# Patient Record
Sex: Male | Born: 1970
Health system: Southern US, Community
[De-identification: ages and names within clinical notes are randomized; demographics above are authoritative.]

## PROBLEM LIST (undated history)

## (undated) DIAGNOSIS — F419 Anxiety disorder, unspecified: Secondary | ICD-10-CM

## (undated) DIAGNOSIS — K219 Gastro-esophageal reflux disease without esophagitis: Secondary | ICD-10-CM

## (undated) DIAGNOSIS — D179 Benign lipomatous neoplasm, unspecified: Secondary | ICD-10-CM

## (undated) DIAGNOSIS — J189 Pneumonia, unspecified organism: Secondary | ICD-10-CM

## (undated) DIAGNOSIS — M1990 Unspecified osteoarthritis, unspecified site: Secondary | ICD-10-CM

## (undated) DIAGNOSIS — F329 Major depressive disorder, single episode, unspecified: Secondary | ICD-10-CM

## (undated) DIAGNOSIS — F32A Depression, unspecified: Secondary | ICD-10-CM

## (undated) DIAGNOSIS — I10 Essential (primary) hypertension: Secondary | ICD-10-CM

## (undated) DIAGNOSIS — J45909 Unspecified asthma, uncomplicated: Secondary | ICD-10-CM

## (undated) HISTORY — DX: Essential (primary) hypertension: I10

## (undated) HISTORY — DX: Major depressive disorder, single episode, unspecified: F32.9

## (undated) HISTORY — DX: Anxiety disorder, unspecified: F41.9

## (undated) HISTORY — PX: TONSILLECTOMY: SUR1361

## (undated) HISTORY — DX: Depression, unspecified: F32.A

---
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
## 2003-10-31 HISTORY — PX: LIPOMA EXCISION: SHX5283

## 2004-08-11 ENCOUNTER — Other Ambulatory Visit: Admission: RE | Admit: 2004-08-11 | Discharge: 2004-08-11 | Payer: Self-pay | Admitting: Otolaryngology

## 2007-05-29 ENCOUNTER — Emergency Department (HOSPITAL_COMMUNITY): Admission: EM | Admit: 2007-05-29 | Discharge: 2007-05-29 | Payer: Self-pay | Admitting: Emergency Medicine

## 2011-08-14 LAB — URINALYSIS, ROUTINE W REFLEX MICROSCOPIC
Bilirubin Urine: NEGATIVE
Glucose, UA: NEGATIVE
Ketones, ur: NEGATIVE
Protein, ur: NEGATIVE
pH: 6.5

## 2011-08-14 LAB — CBC
HCT: 42.2
Hemoglobin: 14.6
MCHC: 34.6
MCV: 92.9
Platelets: 196
RDW: 12.8

## 2011-08-14 LAB — BASIC METABOLIC PANEL
BUN: 20
CO2: 28
Chloride: 103
Glucose, Bld: 105 — ABNORMAL HIGH
Potassium: 4.7
Sodium: 137

## 2011-08-14 LAB — DIFFERENTIAL
Basophils Absolute: 0
Eosinophils Absolute: 0.2
Eosinophils Relative: 2
Lymphocytes Relative: 25
Monocytes Absolute: 0.7

## 2011-08-14 LAB — RAPID URINE DRUG SCREEN, HOSP PERFORMED
Benzodiazepines: NOT DETECTED
Cocaine: NOT DETECTED
Opiates: NOT DETECTED

## 2014-04-04 ENCOUNTER — Emergency Department (HOSPITAL_COMMUNITY): Payer: BC Managed Care – PPO

## 2014-04-04 ENCOUNTER — Encounter (HOSPITAL_COMMUNITY): Payer: Self-pay | Admitting: Emergency Medicine

## 2014-04-04 ENCOUNTER — Emergency Department (HOSPITAL_COMMUNITY)
Admission: EM | Admit: 2014-04-04 | Discharge: 2014-04-05 | Disposition: A | Discharge status: Home / Self Care | Payer: BC Managed Care – PPO | Attending: Emergency Medicine | Admitting: Emergency Medicine

## 2014-04-04 DIAGNOSIS — R1011 Right upper quadrant pain: Secondary | ICD-10-CM | POA: Insufficient documentation

## 2014-04-04 DIAGNOSIS — Z79899 Other long term (current) drug therapy: Secondary | ICD-10-CM | POA: Insufficient documentation

## 2014-04-04 DIAGNOSIS — R109 Unspecified abdominal pain: Secondary | ICD-10-CM

## 2014-04-04 LAB — CBC WITH DIFFERENTIAL/PLATELET
Basophils Absolute: 0.1 10*3/uL (ref 0.0–0.1)
Basophils Relative: 1 % (ref 0–1)
EOS ABS: 0.3 10*3/uL (ref 0.0–0.7)
Eosinophils Relative: 4 % (ref 0–5)
HCT: 45 % (ref 39.0–52.0)
HEMOGLOBIN: 15.5 g/dL (ref 13.0–17.0)
LYMPHS ABS: 2 10*3/uL (ref 0.7–4.0)
LYMPHS PCT: 27 % (ref 12–46)
MCH: 31.6 pg (ref 26.0–34.0)
MCHC: 34.4 g/dL (ref 30.0–36.0)
MCV: 91.6 fL (ref 78.0–100.0)
MONOS PCT: 10 % (ref 3–12)
Monocytes Absolute: 0.8 10*3/uL (ref 0.1–1.0)
NEUTROS PCT: 58 % (ref 43–77)
Neutro Abs: 4.5 10*3/uL (ref 1.7–7.7)
Platelets: 200 10*3/uL (ref 150–400)
RBC: 4.91 MIL/uL (ref 4.22–5.81)
RDW: 12.9 % (ref 11.5–15.5)
WBC: 7.6 10*3/uL (ref 4.0–10.5)

## 2014-04-04 LAB — COMPREHENSIVE METABOLIC PANEL
ALK PHOS: 55 U/L (ref 39–117)
ALT: 64 U/L — ABNORMAL HIGH (ref 0–53)
AST: 51 U/L — ABNORMAL HIGH (ref 0–37)
Albumin: 4.4 g/dL (ref 3.5–5.2)
BILIRUBIN TOTAL: 0.3 mg/dL (ref 0.3–1.2)
BUN: 18 mg/dL (ref 6–23)
CO2: 25 meq/L (ref 19–32)
Calcium: 10 mg/dL (ref 8.4–10.5)
Chloride: 100 mEq/L (ref 96–112)
Creatinine, Ser: 1.15 mg/dL (ref 0.50–1.35)
GFR, EST AFRICAN AMERICAN: 89 mL/min — AB (ref >90)
GFR, EST NON AFRICAN AMERICAN: 77 mL/min — AB (ref >90)
GLUCOSE: 54 mg/dL — AB (ref 70–99)
POTASSIUM: 3.7 meq/L (ref 3.7–5.3)
Sodium: 141 mEq/L (ref 137–147)
TOTAL PROTEIN: 8.2 g/dL (ref 6.0–8.3)

## 2014-04-04 LAB — CBG MONITORING, ED: Glucose-Capillary: 71 mg/dL (ref 70–99)

## 2014-04-04 LAB — LIPASE, BLOOD: LIPASE: 43 U/L (ref 11–59)

## 2014-04-04 MED ORDER — IOHEXOL 300 MG/ML  SOLN
100.0000 mL | Freq: Once | INTRAMUSCULAR | Status: AC | PRN
  Administered 2014-04-04: 100 mL via INTRAVENOUS

## 2014-04-04 MED ORDER — MORPHINE SULFATE 4 MG/ML IJ SOLN
4.0000 mg | Freq: Once | INTRAMUSCULAR | Status: AC
  Administered 2014-04-04: 4 mg via INTRAVENOUS
  Filled 2014-04-04: qty 1

## 2014-04-04 MED ORDER — SODIUM CHLORIDE 0.9 % IV SOLN
Freq: Once | INTRAVENOUS | Status: AC
Start: 2014-04-04 — End: 2014-04-04
  Administered 2014-04-04: 21:00:00 via INTRAVENOUS

## 2014-04-04 MED ORDER — ONDANSETRON HCL 4 MG/2ML IJ SOLN
4.0000 mg | Freq: Once | INTRAMUSCULAR | Status: AC
  Administered 2014-04-04: 4 mg via INTRAVENOUS
  Filled 2014-04-04: qty 2

## 2014-04-04 NOTE — ED Notes (Signed)
Pt states he thought he had injured his right side of rib care. Parents both had gall bladders removed. Pt presents with ride sided pain, states he cannot make it worse when pressing on rib cage. States he has also had diarrhea for one week but it ended today. States that could be unrelated and it could have been the [employer] he ate. Pt states pain aggravates when he moves his arms, states he is concerned as well because he has been drinking more beer this year than usual so he is worried it might be his liver, after his wife and 8 kids left him. Pt states he has been under a lot of stress.

## 2014-04-04 NOTE — ED Notes (Signed)
Pain is also worse with deep breaths.

## 2014-04-04 NOTE — ED Notes (Signed)
Called CT to make them aware that patient has finished his contrast.

## 2014-04-04 NOTE — ED Notes (Signed)
Pt.  reports pain at right lateral ribcage for 3 weeks worse when raising right arm /bending and certain positions , respirations unlabored / denies chest pain / no crepitance or deformity.

## 2014-04-05 MED ORDER — HYDROCODONE-ACETAMINOPHEN 5-325 MG PO TABS: 1.0000 | ORAL_TABLET | ORAL | Status: DC | PRN

## 2014-04-05 MED ORDER — IBUPROFEN 600 MG PO TABS: 600.0000 mg | ORAL_TABLET | Freq: Four times a day (QID) | ORAL | Status: DC | PRN

## 2014-04-05 NOTE — ED Provider Notes (Signed)
CSN: [id number]     Arrival date & time 04/04/14  1949 History   First MD Initiated Contact with Patient 04/04/14 2043     Chief Complaint  Patient presents with  . Abdominal Pain    right sided     (Consider location/radiation/quality/duration/timing/severity/associated sxs/prior Treatment) HPI Comments: Patient with 3, weeks of right upper quadrant, pain, and right lateral side.  Pain cannot relate this to food consumption.  He does, state that he has been drinking more than usual for the past 6 months or so since a divorce and separation from his children.  Denies any trauma, fever, diarrhea, vomiting.  Has not really taken any medication for this discomfort  Patient is a 43 y.o. male presenting with abdominal pain. The history is provided by the patient.  Abdominal Pain Pain location:  RUQ Pain quality: aching   Pain radiates to:  Does not radiate Associated symptoms: no chills, no cough, no diarrhea, no fever, no nausea, no shortness of breath and no vomiting     History reviewed. No pertinent past medical history. History reviewed. No pertinent past surgical history. No family history on file. History  Substance Use Topics  . Smoking status: Never Smoker   . Smokeless tobacco: Not on file  . Alcohol Use: No    Review of Systems  Constitutional: Negative for fever and chills.  Respiratory: Negative for cough and shortness of breath.   Gastrointestinal: Positive for abdominal pain. Negative for nausea, vomiting and diarrhea.  Genitourinary: Negative for flank pain.  Skin: Negative for rash.  All other systems reviewed and are negative.     Allergies  Review of patient's allergies indicates no known allergies.  Home Medications   Prior to Admission medications   Medication Sig Start Date End Date Taking? Authorizing Provider  Aspirin-Acetaminophen-Caffeine (EXCEDRIN PO) Take 2 tablets by mouth every 8 (eight) hours as needed (headache/pain).   Yes Historical  Provider, MD  FLUoxetine (PROZAC) 20 MG capsule Take 20 mg by mouth daily.   Yes Historical Provider, MD  HYDROcodone-acetaminophen (NORCO/VICODIN) 5-325 MG per tablet Take 1 tablet by mouth every 4 (four) hours as needed. 04/05/14   Garald Balding, NP  ibuprofen (ADVIL,MOTRIN) 600 MG tablet Take 1 tablet (600 mg total) by mouth every 6 (six) hours as needed. 04/05/14   Garald Balding, NP   BP 137/96  Pulse 61  Temp(Src) 98.4 F (36.9 C) (Oral)  Resp 18  Ht 6' (1.829 m)  Wt 188 lb (85.276 kg)  BMI 25.49 kg/m2  SpO2 98% Physical Exam  Nursing note and vitals reviewed. Constitutional: He is oriented to person, place, and time. He appears well-developed and well-nourished.  HENT:  Head: Normocephalic.  Eyes: Pupils are equal, round, and reactive to light.  Neck: Normal range of motion.  Cardiovascular: Normal rate and regular rhythm.   Pulmonary/Chest: Effort normal and breath sounds normal.  Abdominal: Soft. He exhibits no distension. There is tenderness in the right upper quadrant. There is positive Murphy's sign. There is no CVA tenderness.    Musculoskeletal: Normal range of motion.  Neurological: He is alert and oriented to person, place, and time.  Skin: Skin is warm. No rash noted.    ED Course  Procedures (including critical care time) Labs Review Labs Reviewed  COMPREHENSIVE METABOLIC PANEL - Abnormal; Notable for the following:    Glucose, Bld 54 (*)    AST 51 (*)    ALT 64 (*)    GFR calc non Af  Amer 77 (*)    GFR calc Af Amer 89 (*)    All other components within normal limits  CBC WITH DIFFERENTIAL  LIPASE, BLOOD  CBG MONITORING, ED    Imaging Review Dg Ribs Unilateral W/chest Right  04/04/2014   CLINICAL DATA:  Pain in the right axillary region for 3 weeks without known injury  EXAM: RIGHT RIBS AND CHEST - 3+ VIEW  COMPARISON:  None.  FINDINGS: The lungs are well-expanded and clear. The heart and mediastinal structures are normal. There is no pleural effusion or  pneumothorax. The symptomatic area over the right lower ribcage was marked with a metallic BB. There is no acute or chronic bony abnormality.  IMPRESSION: There is no acute cardiopulmonary abnormality nor acute the bony thoracic abnormality. If the patient's symptoms persist and remain unexplained, follow-up CT scanning may be useful.   Electronically Signed   By: David  Martinique   On: 04/04/2014 20:47   Ct Abdomen Pelvis W Contrast  04/04/2014   CLINICAL DATA:  Right-sided abdominal pain.  EXAM: CT ABDOMEN AND PELVIS WITH CONTRAST  TECHNIQUE: Multidetector CT imaging of the abdomen and pelvis was performed using the standard protocol following bolus administration of intravenous contrast.  CONTRAST:  130mL OMNIPAQUE IOHEXOL 300 MG/ML  SOLN  COMPARISON:  None.  FINDINGS: The lung bases are clear. No pleural effusion or pulmonary lesion. Minimal streaky subsegmental atelectasis at the left lung base. The heart is normal in size. No pericardial effusion. The distal esophagus is grossly normal. The lower ribs are intact.  The solid abdominal organs are normal. No inflammatory changes or mass lesions. The gallbladder is normal. Borderline common bile duct at 7.5 mm. The pancreatic duct is normal.  The stomach, duodenum, small bowel and colon are unremarkable. No inflammatory changes, mass lesions or obstructive findings. The appendix is normal. No mesenteric or retroperitoneal mass or adenopathy. Small scattered mesenteric lymph nodes are noted. The aorta is normal in caliber. The branch vessels are normal. The major venous structures are patent.  The bladder, prostate gland and seminal vesicles are unremarkable. No pelvic mass, adenopathy or free pelvic fluid collections. No inguinal mass or adenopathy.  The bony structures are unremarkable.  IMPRESSION: Unremarkable abdominal/ pelvic CT scan. No acute findings, mass lesions or lymphadenopathy.   Electronically Signed   By: Kalman Jewels M.D.   On: 04/04/2014 23:57       EKG Interpretation None      MDM  CT scan, is normal, not revealing any cause for his discomfort is noted, that the common bile duct is slightly dilated.  Gallbladder is surgically absent.  LFTs show slight elevation in AST, ALT.  Patient has been cautioned that he needs to stop drinking and have this monitored by his primary care physician. Is been discharged him with prescription for 10 Vicodin to use as needed.  For severe pain Final diagnoses:  Abdominal pain         Garald Balding, NP 04/05/14 [phone]

## 2014-04-05 NOTE — Discharge Instructions (Signed)
Abdominal Pain, Adult Many things can cause belly (abdominal) pain. Most times, the belly pain is not dangerous. Many cases of belly pain can be watched and treated at home. HOME CARE   Do not take medicines that help you go poop (laxatives) unless told to by your doctor.  Only take medicine as told by your doctor.  Eat or drink as told by your doctor. Your doctor will tell you if you should be on a special diet. GET HELP IF:  You do not know what is causing your belly pain.  You have belly pain while you are sick to your stomach (nauseous) or have runny poop (diarrhea).  You have pain while you pee or poop.  Your belly pain wakes you up at night.  You have belly pain that gets worse or better when you eat.  You have belly pain that gets worse when you eat fatty foods. GET HELP RIGHT AWAY IF:   The pain does not go away within 2 hours.  You have a fever.  You keep throwing up (vomiting).  The pain changes and is only in the right or left part of the belly.  You have bloody or tarry looking poop. MAKE SURE YOU:   Understand these instructions.  Will watch your condition.  Will get help right away if you are not doing well or get worse. Document Released: 04/03/2008 Document Revised: 08/06/2013 Document Reviewed: 06/25/2013 [facility] Patient Information 2014 [place]. Your CT Scan is normal you have been give na prescription for pain control and a referral to your PCP for follow up care You do have mildly elevated liver function test this will ned to be monitored  It is recommended that you stop drinking on a regular basis as this is starting effect your liver.

## 2014-04-05 NOTE — ED Provider Notes (Signed)
Medical screening examination/treatment/procedure(s) were performed by non-physician practitioner and as supervising physician I was immediately available for consultation/collaboration.   Dorie Rank, MD 04/05/14 [phone]

## 2018-01-06 ENCOUNTER — Emergency Department (HOSPITAL_BASED_OUTPATIENT_CLINIC_OR_DEPARTMENT_OTHER)
Admission: EM | Admit: 2018-01-06 | Discharge: 2018-01-06 | Disposition: A | Discharge status: Home / Self Care | Payer: Managed Care, Other (non HMO) | Attending: Emergency Medicine | Admitting: Emergency Medicine

## 2018-01-06 ENCOUNTER — Other Ambulatory Visit: Payer: Self-pay

## 2018-01-06 ENCOUNTER — Emergency Department (HOSPITAL_BASED_OUTPATIENT_CLINIC_OR_DEPARTMENT_OTHER): Payer: Managed Care, Other (non HMO)

## 2018-01-06 ENCOUNTER — Encounter (HOSPITAL_BASED_OUTPATIENT_CLINIC_OR_DEPARTMENT_OTHER): Payer: Self-pay | Admitting: Emergency Medicine

## 2018-01-06 DIAGNOSIS — R109 Unspecified abdominal pain: Secondary | ICD-10-CM | POA: Insufficient documentation

## 2018-01-06 DIAGNOSIS — Z79899 Other long term (current) drug therapy: Secondary | ICD-10-CM | POA: Insufficient documentation

## 2018-01-06 LAB — CBC WITH DIFFERENTIAL/PLATELET
BASOS ABS: 0 10*3/uL (ref 0.0–0.1)
BASOS PCT: 0 %
EOS PCT: 2 %
Eosinophils Absolute: 0.2 10*3/uL (ref 0.0–0.7)
HCT: 45.9 % (ref 39.0–52.0)
Hemoglobin: 15.7 g/dL (ref 13.0–17.0)
Lymphocytes Relative: 26 %
Lymphs Abs: 2.1 10*3/uL (ref 0.7–4.0)
MCH: 32 pg (ref 26.0–34.0)
MCHC: 34.2 g/dL (ref 30.0–36.0)
MCV: 93.5 fL (ref 78.0–100.0)
MONO ABS: 1 10*3/uL (ref 0.1–1.0)
Monocytes Relative: 12 %
NEUTROS ABS: 4.8 10*3/uL (ref 1.7–7.7)
Neutrophils Relative %: 60 %
PLATELETS: 207 10*3/uL (ref 150–400)
RBC: 4.91 MIL/uL (ref 4.22–5.81)
RDW: 13.6 % (ref 11.5–15.5)
WBC: 8 10*3/uL (ref 4.0–10.5)

## 2018-01-06 LAB — COMPREHENSIVE METABOLIC PANEL
ALBUMIN: 4.5 g/dL (ref 3.5–5.0)
ALT: 70 U/L — ABNORMAL HIGH (ref 17–63)
AST: 39 U/L (ref 15–41)
Alkaline Phosphatase: 67 U/L (ref 38–126)
Anion gap: 9 (ref 5–15)
BUN: 20 mg/dL (ref 6–20)
CHLORIDE: 106 mmol/L (ref 101–111)
CO2: 27 mmol/L (ref 22–32)
Calcium: 9.5 mg/dL (ref 8.9–10.3)
Creatinine, Ser: 1.32 mg/dL — ABNORMAL HIGH (ref 0.61–1.24)
GFR calc Af Amer: >60 mL/min (ref >60)
GFR calc non Af Amer: >60 mL/min (ref >60)
GLUCOSE: 92 mg/dL (ref 65–99)
Potassium: 4.3 mmol/L (ref 3.5–5.1)
Sodium: 142 mmol/L (ref 135–145)
Total Bilirubin: 0.5 mg/dL (ref 0.3–1.2)
Total Protein: 7.9 g/dL (ref 6.5–8.1)

## 2018-01-06 LAB — URINALYSIS, ROUTINE W REFLEX MICROSCOPIC
Bilirubin Urine: NEGATIVE
GLUCOSE, UA: NEGATIVE mg/dL
HGB URINE DIPSTICK: NEGATIVE
KETONES UR: NEGATIVE mg/dL
LEUKOCYTES UA: NEGATIVE
Nitrite: NEGATIVE
PROTEIN: NEGATIVE mg/dL
Specific Gravity, Urine: <1.005 — ABNORMAL LOW (ref 1.005–1.030)
pH: 5.5 (ref 5.0–8.0)

## 2018-01-06 LAB — LIPASE, BLOOD: Lipase: 23 U/L (ref 11–51)

## 2018-01-06 MED ORDER — CYCLOBENZAPRINE HCL 10 MG PO TABS: 10.0000 mg | ORAL_TABLET | Freq: Two times a day (BID) | ORAL | 0 refills | Status: DC | PRN

## 2018-01-06 NOTE — ED Notes (Signed)
Not in room, pt in xray. 

## 2018-01-06 NOTE — ED Triage Notes (Signed)
Pt recently tx for flu. States he has been sore from coughing. Pt reports he has R side flank pain that is worse today. No tenderness to palpation.

## 2018-01-06 NOTE — ED Notes (Signed)
EDP into room 

## 2018-01-06 NOTE — ED Provider Notes (Signed)
[place] EMERGENCY DEPARTMENT Provider Note   CSN: [id number] Arrival date & time: 01/06/18  1529     History   Chief Complaint Chief Complaint  Patient presents with  . Flank Pain    HPI Christopher Andersen is a 47 y.o. male.  HPI  3 wk with right flank pain, had cough beginning one month ago. Went to urgent care 1 week ago, taking ibuprofen and cough medication without relief.  Thought it was muscle strain from coughing. Today pain has increased, worst it has been. Doesn't want to move given severe pain.  Not coughing anymore.   Not moving helps pain. Rolling over, getting up makes it worse. Can't lay on that side.  No fevers, no nausea, no vomiting, no abdominal pain, no dysuria, no hematuria, no numbness/weakness, no chest pain or dyspnea, no leg or leg swelling.  History reviewed. No pertinent past medical history.  There are no active problems to display for this patient.   History reviewed. No pertinent surgical history.     Home Medications    Prior to Admission medications   Medication Sig Start Date End Date Taking? Authorizing Provider  citalopram (CELEXA) 20 MG tablet Take 20 mg by mouth daily.   Yes [provider]  mirtazapine (REMERON) 30 MG tablet Take 30 mg by mouth at bedtime.   Yes [provider]  Aspirin-Acetaminophen-Caffeine (EXCEDRIN PO) Take 2 tablets by mouth every 8 (eight) hours as needed (headache/pain).    [provider]  cyclobenzaprine (FLEXERIL) 10 MG tablet Take 1 tablet (10 mg total) by mouth 2 (two) times daily as needed for muscle spasms. 01/06/18   Gareth Morgan, MD  FLUoxetine (PROZAC) 20 MG capsule Take 20 mg by mouth daily.    [provider]  HYDROcodone-acetaminophen (NORCO/VICODIN) 5-325 MG per tablet Take 1 tablet by mouth every 4 (four) hours as needed. 04/05/14   Junius Creamer, NP  ibuprofen (ADVIL,MOTRIN) 600 MG tablet Take 1 tablet (600 mg total) by mouth every 6 (six) hours as  needed. 04/05/14   Junius Creamer, NP    Family History No family history on file.  Social History Social History   Tobacco Use  . Smoking status: Never Smoker  . Smokeless tobacco: Never Used  Substance Use Topics  . Alcohol use: Yes  . Drug use: No     Allergies   Patient has no known allergies.   Review of Systems Review of Systems  Constitutional: Negative for fever.  HENT: Negative for sore throat.   Eyes: Negative for visual disturbance.  Respiratory: Negative for cough and shortness of breath.   Cardiovascular: Negative for chest pain.  Gastrointestinal: Positive for abdominal pain (denies but then on exam reports area of pain also in RUQ). Negative for diarrhea, nausea and vomiting.  Genitourinary: Positive for flank pain. Negative for difficulty urinating and hematuria.  Musculoskeletal: Negative for back pain and neck stiffness.  Skin: Negative for rash.  Neurological: Negative for syncope and headaches.     Physical Exam Updated Vital Signs BP (!) 154/94 (BP Location: Right Arm)   Pulse 80   Temp 98.5 F (36.9 C) (Oral)   Resp 16   Ht 6' (1.829 m)   Wt 100.7 kg (222 lb)   SpO2 97%   BMI 30.11 kg/m   Physical Exam  Constitutional: He is oriented to person, place, and time. He appears well-developed and well-nourished. No distress.  HENT:  Head: Normocephalic and atraumatic.  Eyes: Conjunctivae and EOM  are normal.  Neck: Normal range of motion.  Cardiovascular: Normal rate, regular rhythm, normal heart sounds and intact distal pulses. Exam reveals no gallop and no friction rub.  No murmur heard. Pulmonary/Chest: Effort normal and breath sounds normal. No respiratory distress. He has no wheezes. He has no rales.  Abdominal: Soft. He exhibits no distension. There is tenderness (epigastrum, RUQ, neg murphy's). There is no guarding.  No cva tenderness   Musculoskeletal: He exhibits no edema.  Neurological: He is alert and oriented to person, place, and  time.  Skin: Skin is warm and dry. He is not diaphoretic.  Nursing note and vitals reviewed.    ED Treatments / Results  Labs (all labs ordered are listed, but only abnormal results are displayed) Labs Reviewed  URINALYSIS, ROUTINE W REFLEX MICROSCOPIC - Abnormal; Notable for the following components:      Result Value   Color, Urine STRAW (*)    Specific Gravity, Urine <1.005 (*)    All other components within normal limits  COMPREHENSIVE METABOLIC PANEL - Abnormal; Notable for the following components:   Creatinine, Ser 1.32 (*)    ALT 70 (*)    All other components within normal limits  CBC WITH DIFFERENTIAL/PLATELET  LIPASE, BLOOD    EKG  EKG Interpretation None       Radiology Dg Chest 2 View  Result Date: 01/06/2018 CLINICAL DATA:  Chest pain. EXAM: CHEST - 2 VIEW COMPARISON:  April 04, 2014 FINDINGS: The heart size and mediastinal contours are within normal limits. Both lungs are clear. The visualized skeletal structures are unremarkable. IMPRESSION: No active cardiopulmonary disease. Electronically Signed   By: Dorise Bullion III M.D   On: 01/06/2018 19:19    Procedures Procedures (including critical care time)  Medications Ordered in ED Medications - No data to display   Initial Impression / Assessment and Plan / ED Course  I have reviewed the triage vital signs and the nursing notes.  Pertinent labs & imaging results that were available during my care of the patient were reviewed by me and considered in my medical decision making (see chart for details).    47 year old male with no segment medical history presents with 3 weeks of right-sided flank pain.  Reports that the pain was initially diagnosed as a muscular strain secondary to coughing, does acknowledge its worse with movement, however has continued despite him no longer coughing.  Patient with certain portions of history which are concerning for muscular skeletal pain, for example pain that is worse  with movements, but does not exhibit tenderness over the area that he reports having pain.  He does have some mild right upper quadrant tenderness, and CMP and lipase were done which showed no significant abnormalities.  Have low suspicion for acute cholecystitis, given he is afebrile, without nausea or vomiting, no pain that is worse after eating.  Have low suspicion for pulmonary embolus given he is PERC negative without dyspnea.  Hx and exam not consistent with no nephrolithiasis, and UA shows no sign of urinary tract infection.  Recommend patient find a primary care physician for continued evaluation of the right flank pain.  Will give prescription for Flexeril for further relief of possible muscular pain, but do feel he requires further outpatient evaluation.  Final Clinical Impressions(s) / ED Diagnoses   Final diagnoses:  Right flank pain    ED Discharge Orders        Ordered    cyclobenzaprine (FLEXERIL) 10 MG tablet  2  times daily PRN     01/06/18 2015       Gareth Morgan, MD 01/07/18 1036

## 2018-01-06 NOTE — ED Notes (Signed)
Back from xray. Amiable, alert, NAD, calm, interactive, resps e/u, speaking in clear complete sentences, no dyspnea noted, skin W&D, VSS, reports pain with movement or laughing, "fine while resting still" (denies: pain at this time, sob, nausea, dizziness or visual changes).

## 2018-01-15 ENCOUNTER — Encounter: Payer: Self-pay | Admitting: Medical

## 2018-01-15 ENCOUNTER — Ambulatory Visit (INDEPENDENT_AMBULATORY_CARE_PROVIDER_SITE_OTHER): Payer: Managed Care, Other (non HMO) | Admitting: Medical

## 2018-01-15 VITALS — BP 132/90 | HR 87 | Temp 97.8°F | Resp 16 | Ht 72.0 in | Wt 222.0 lb

## 2018-01-15 DIAGNOSIS — F329 Major depressive disorder, single episode, unspecified: Secondary | ICD-10-CM

## 2018-01-15 DIAGNOSIS — R0781 Pleurodynia: Secondary | ICD-10-CM

## 2018-01-15 DIAGNOSIS — F419 Anxiety disorder, unspecified: Secondary | ICD-10-CM | POA: Insufficient documentation

## 2018-01-15 MED ORDER — CYCLOBENZAPRINE HCL 5 MG PO TABS: 5.0000 mg | ORAL_TABLET | Freq: Three times a day (TID) | ORAL | 1 refills | Status: DC | PRN

## 2018-01-15 MED ORDER — DICLOFENAC SODIUM 75 MG PO TBEC
75.0000 mg | DELAYED_RELEASE_TABLET | Freq: Two times a day (BID) | ORAL | 0 refills | Status: DC
Start: 2018-01-15 — End: 2018-01-30

## 2018-01-15 NOTE — Progress Notes (Signed)
Subjective:    Patient ID: Christopher Andersen, male    DOB: 07/29/1971, 47 y.o.   MRN: [id number]  HPI    Pt in for first time. Pt works [employer]. Supervisor at call center. Does not exercise regularly. Pt states eats moderate healthy/admits to much fried foods, 4-6 cups of coffee a day, alcohol use about 3-5 beers a day. Divorced. 8 children. Youngest 53 yo. Oldest 52 years old.   Pt states 2 weeks ago had severe cough and eventually got some rib pain, flank pain and back pain. He went to the emergency dept and had work out which was negative. The pain location varies sometimes in rt lower rib and left lower rib area. Pain will change location. Pain at time acute when breaths deep, cough or sneezes. No reporting any direct abdomen pain. No skin rash. Pt states was extreme and constant preceding pain in ribs described.  In ED on 01-06-2018 xray was done. Was negative. CMP done mild alt elevation and CR 1.32. His cbc was normal.  Urine looked clear.  Pt states since work up was negative he was told pain probably muscular. He points out that pain decreased with muscle relaxant so he thinks maybe is muscle pain.   Pt has history of anxiety with depression. Pt is on remeron and cymbalta. Pt saw psychiatrist on 01-01-2018. Mood is stable/good.    Review of Systems  Constitutional: Negative for chills, fatigue and fever.  Respiratory: Negative for cough, chest tightness, shortness of breath and wheezing.   Cardiovascular: Negative for chest pain and palpitations.  Gastrointestinal: Negative for abdominal distention, abdominal pain, anal bleeding, blood in stool and nausea.  Musculoskeletal: Negative for back pain, joint swelling, myalgias and neck pain.       Rib pain lower ribs. See hpi.  Just got back from [employer] and [employer].  Skin: Negative for rash.  Neurological: Negative for dizziness, seizures, speech difficulty, weakness, light-headedness and numbness.    Hematological: Negative for adenopathy. Does not bruise/bleed easily.  Psychiatric/Behavioral: Negative for behavioral problems and confusion. The patient is not nervous/anxious.     Past Medical History:  Diagnosis Date  . Depression      Social History   Socioeconomic History  . Marital status: Divorced    Spouse name: Not on file  . Number of children: Not on file  . Years of education: Not on file  . Highest education level: Not on file  Social Needs  . Financial resource strain: Not on file  . Food insecurity - worry: Not on file  . Food insecurity - inability: Not on file  . Transportation needs - medical: Not on file  . Transportation needs - non-medical: Not on file  Occupational History  . Not on file  Tobacco Use  . Smoking status: Never Smoker  . Smokeless tobacco: Never Used  Substance and Sexual Activity  . Alcohol use: Yes  . Drug use: No  . Sexual activity: Not on file  Other Topics Concern  . Not on file  Social History Narrative  . Not on file    No past surgical history on file.  No family history on file.  No Known Allergies  Current Outpatient Medications on File Prior to Visit  Medication Sig Dispense Refill  . cyclobenzaprine (FLEXERIL) 10 MG tablet Take 1 tablet (10 mg total) by mouth 2 (two) times daily as needed for muscle spasms. 20 tablet 0  . mirtazapine (REMERON) 30 MG tablet  Take 30 mg by mouth at bedtime.     No current facility-administered medications on file prior to visit.     BP 132/90   Pulse 87   Temp 97.8 F (36.6 C) (Oral)   Resp 16   Ht 6' (1.829 m)   Wt 222 lb (100.7 kg)   SpO2 99%   BMI 30.11 kg/m       Objective:   Physical Exam  General Mental Status- Alert. General Appearance- Not in acute distress.   Skin General: Color- Normal Color. Moisture- Normal Moisture.(no rash on pt thorax)  Neck Carotid Arteries- Normal color. Moisture- Normal Moisture. No carotid bruits. No JVD.  Chest and Lung  Exam Auscultation: Breath Sounds:-Normal.  Cardiovascular Auscultation:Rythm- Regular. Murmurs & Other Heart Sounds:Auscultation of the heart reveals- No Murmurs.  Abdomen Inspection:-Inspeection Normal. Palpation/Percussion:Note:No mass. Palpation and Percussion of the abdomen reveal- Non Tender, Non Distended + BS, no rebound or guarding.  No direct tenderness to palpation over right upper quadrant.  Lower ribs- bilaterally. No direct tenderness on either side.    Neurologic Cranial Nerve exam:- CN III-XII intact(No nystagmus), symmetric smile. Drift Test:- No drift. Romberg Exam:- Negative.  Heal to Toe Gait exam:-Normal. Finger to Nose:- Normal/Intact Strength:- 5/5 equal and symmetric strength both upper and lower extremities.      Assessment & Plan:  Your rib pain is still persisting but to less degree than when you went to the ED.  Since there workup was negative I do agree that the pain might be muscular in origin.  I am prescribing diclofenac NSAID and refilling your Flexeril as you report that that did help.  Rx advisement regarding Flexeril use.  Hope and expect the pain in ribs to subside by another 2 weeks.  If it does persist might consider referral to sports medicine.  Also want to make sure that pain does not occur more on the right side or in her right upper quadrant region.  If that were the case would consider ultrasound of liver/gallbladder.  Continue with medications for anxiety and depression prescribed by psychiatrist.  Please schedule complete physical exam for 2-3 weeks.  Follow-up as needed as well.  Mackie Pai, PA-C

## 2018-01-15 NOTE — Patient Instructions (Signed)
Your rib pain is still persisting but to less degree than when you went to the ED.  Since there workup was negative I do agree that the pain might be muscular in origin.  I am prescribing diclofenac NSAID and refilling your Flexeril as you report that that did help.  Rx advisement regarding Flexeril use.  Hope and expect the pain in ribs to subside by another 2 weeks.  If it does persist might consider referral to sports medicine.  Also want to make sure that pain does not occur more on the right side or in her right upper quadrant region.  If that were the case would consider ultrasound of liver/gallbladder.  Continue with medications for anxiety and depression prescribed by psychiatrist.  Please schedule complete physical exam for 2-3 weeks.  Follow-up as needed as well.

## 2018-01-17 ENCOUNTER — Telehealth: Payer: Self-pay

## 2018-01-17 NOTE — Telephone Encounter (Signed)
Copied from [place] [phone]. Topic: Inquiry >> Jan 16, 2018 12:56 PM Oliver Pila B wrote: Reason for CRM: pt states pain on left side has returned and is uncomfortable to walk; pt wants [facility] to know, contact pt to advise  Please call and schedule pt.

## 2018-01-17 NOTE — Telephone Encounter (Signed)
Patient has appointment for 01/30/2018-

## 2018-01-30 ENCOUNTER — Encounter: Payer: Self-pay | Admitting: Medical

## 2018-01-30 ENCOUNTER — Ambulatory Visit (INDEPENDENT_AMBULATORY_CARE_PROVIDER_SITE_OTHER): Payer: Managed Care, Other (non HMO) | Admitting: Medical

## 2018-01-30 VITALS — BP 130/90 | HR 95 | Temp 97.7°F | Resp 16 | Ht 72.0 in | Wt 219.2 lb

## 2018-01-30 DIAGNOSIS — Z23 Encounter for immunization: Secondary | ICD-10-CM

## 2018-01-30 DIAGNOSIS — K921 Melena: Secondary | ICD-10-CM

## 2018-01-30 DIAGNOSIS — Z0000 Encounter for general adult medical examination without abnormal findings: Secondary | ICD-10-CM | POA: Diagnosis not present

## 2018-01-30 LAB — URINALYSIS, ROUTINE W REFLEX MICROSCOPIC
Bilirubin Urine: NEGATIVE
HGB URINE DIPSTICK: NEGATIVE
Ketones, ur: NEGATIVE
Leukocytes, UA: NEGATIVE
Nitrite: NEGATIVE
PH: 6 (ref 5.0–8.0)
RBC / HPF: NONE SEEN (ref 0–?)
Specific Gravity, Urine: >=1.03 — AB (ref 1.000–1.030)
Total Protein, Urine: NEGATIVE
Urine Glucose: NEGATIVE
Urobilinogen, UA: 0.2 (ref 0.0–1.0)

## 2018-01-30 LAB — LIPID PANEL
CHOL/HDL RATIO: 4
CHOLESTEROL: 233 mg/dL — AB (ref 0–200)
HDL: 53.5 mg/dL (ref >39.00)
NonHDL: 179.33
TRIGLYCERIDES: 297 mg/dL — AB (ref 0.0–149.0)
VLDL: 59.4 mg/dL — ABNORMAL HIGH (ref 0.0–40.0)

## 2018-01-30 LAB — COMPREHENSIVE METABOLIC PANEL
ALBUMIN: 4.4 g/dL (ref 3.5–5.2)
ALK PHOS: 67 U/L (ref 39–117)
ALT: 64 U/L — ABNORMAL HIGH (ref 0–53)
AST: 38 U/L — ABNORMAL HIGH (ref 0–37)
BUN: 22 mg/dL (ref 6–23)
CALCIUM: 10 mg/dL (ref 8.4–10.5)
CHLORIDE: 100 meq/L (ref 96–112)
CO2: 30 mEq/L (ref 19–32)
Creatinine, Ser: 1.24 mg/dL (ref 0.40–1.50)
GFR: 66.68 mL/min (ref >60.00)
Glucose, Bld: 99 mg/dL (ref 70–99)
POTASSIUM: 4.5 meq/L (ref 3.5–5.1)
Sodium: 138 mEq/L (ref 135–145)
Total Bilirubin: 0.4 mg/dL (ref 0.2–1.2)
Total Protein: 7.7 g/dL (ref 6.0–8.3)

## 2018-01-30 LAB — CBC WITH DIFFERENTIAL/PLATELET
BASOS PCT: 0.9 % (ref 0.0–3.0)
Basophils Absolute: 0.1 10*3/uL (ref 0.0–0.1)
EOS PCT: 4.1 % (ref 0.0–5.0)
Eosinophils Absolute: 0.2 10*3/uL (ref 0.0–0.7)
HEMATOCRIT: 46.1 % (ref 39.0–52.0)
HEMOGLOBIN: 15.9 g/dL (ref 13.0–17.0)
LYMPHS PCT: 37.8 % (ref 12.0–46.0)
Lymphs Abs: 2.1 10*3/uL (ref 0.7–4.0)
MCHC: 34.6 g/dL (ref 30.0–36.0)
MCV: 93.1 fl (ref 78.0–100.0)
Monocytes Absolute: 0.5 10*3/uL (ref 0.1–1.0)
Monocytes Relative: 9.3 % (ref 3.0–12.0)
Neutro Abs: 2.7 10*3/uL (ref 1.4–7.7)
Neutrophils Relative %: 47.9 % (ref 43.0–77.0)
Platelets: 198 10*3/uL (ref 150.0–400.0)
RBC: 4.95 Mil/uL (ref 4.22–5.81)
RDW: 13.2 % (ref 11.5–15.5)
WBC: 5.5 10*3/uL (ref 4.0–10.5)

## 2018-01-30 LAB — LDL CHOLESTEROL, DIRECT: LDL DIRECT: 135 mg/dL

## 2018-01-30 NOTE — Progress Notes (Signed)
Subjective:    Patient ID: Christopher Andersen, male    DOB: August 06, 1971, 47 y.o.   MRN: [id number]  HPI  Pt in for wellness exam.  Pt in for first time. Pt works [employer]. Supervisor at call center. Does not exercise regularly. Pt states eats moderate healthy/admits to much fried foods, 4-6 cups of coffee a day, alcohol use about 3-5 beers a day. Divorced. 8 children. Youngest 65 yo. Oldest 83 years old.   Also needs tdap.  Pt is fasting.    Review of Systems  Constitutional: Negative for chills, fatigue and fever.  HENT: Negative for congestion, ear pain, rhinorrhea, sinus pressure, sinus pain and sore throat.   Respiratory: Negative for cough, chest tightness, shortness of breath and wheezing.   Cardiovascular: Negative for chest pain and palpitations.  Gastrointestinal: Negative for abdominal distention, anal bleeding, constipation, diarrhea and nausea.       Pt has some occasionaly bright red blood on toilet paper when he wipes. He has this intermittenlty for years for 10 years. Pt had colonscopy in 2009 and 2010. He was told was hemorrhoid.   Pt note sure who did colonoscopy.  Then he explains sometimes will have just more blood/loose stools.  Genitourinary: Negative for difficulty urinating, dysuria, flank pain and hematuria.       No known family history of cancer. Maybe bph. Pt not sure.  Musculoskeletal: Negative for back pain.       His former rib pain almost gone completely. Responded to diclofenac.  Skin: Negative for rash.  Neurological: Negative for dizziness, speech difficulty, weakness, numbness and headaches.  Hematological: Negative for adenopathy. Does not bruise/bleed easily.  Psychiatric/Behavioral: Negative for behavioral problems, confusion and hallucinations. The patient is not nervous/anxious.      Past Medical History:  Diagnosis Date  . Anxiety   . Depression      Social History   Socioeconomic History  . Marital status: Divorced    Spouse  name: Not on file  . Number of children: Not on file  . Years of education: Not on file  . Highest education level: Not on file  Occupational History  . Not on file  Social Needs  . Financial resource strain: Not on file  . Food insecurity:    Worry: Not on file    Inability: Not on file  . Transportation needs:    Medical: Not on file    Non-medical: Not on file  Tobacco Use  . Smoking status: Never Smoker  . Smokeless tobacco: Never Used  Substance and Sexual Activity  . Alcohol use: Yes    Comment: 3-5 beers daily.  . Drug use: No  . Sexual activity: Yes  Lifestyle  . Physical activity:    Days per week: Not on file    Minutes per session: Not on file  . Stress: Not on file  Relationships  . Social connections:    Talks on phone: Not on file    Gets together: Not on file    Attends religious service: Not on file    Active member of club or organization: Not on file    Attends meetings of clubs or organizations: Not on file    Relationship status: Not on file  . Intimate partner violence:    Fear of current or ex partner: Not on file    Emotionally abused: Not on file    Physically abused: Not on file    Forced sexual activity: Not on file  Other Topics Concern  . Not on file  Social History Narrative  . Not on file    Past Surgical History:  Procedure Laterality Date  . TONSILLECTOMY      No family history on file.  No Known Allergies  Current Outpatient Medications on File Prior to Visit  Medication Sig Dispense Refill  . cyclobenzaprine (FLEXERIL) 5 MG tablet Take 1 tablet (5 mg total) by mouth 3 (three) times daily as needed for muscle spasms. 21 tablet 1  . mirtazapine (REMERON) 30 MG tablet Take 30 mg by mouth at bedtime.     No current facility-administered medications on file prior to visit.     BP (!) 135/98   Pulse 95   Temp 97.7 F (36.5 C) (Oral)   Resp 16   Ht 6' (1.829 m)   Wt 219 lb 3.2 oz (99.4 kg)   SpO2 100%   BMI 29.73 kg/m         Objective:   Physical Exam  General Mental Status- Alert. General Appearance- Not in acute distress.   Skin General: Color- Normal Color. Moisture- Normal Moisture.  Neck Carotid Arteries- Normal color. Moisture- Normal Moisture. No carotid bruits. No JVD.  Chest and Lung Exam Auscultation: Breath Sounds:-Normal.  Cardiovascular Auscultation:Rythm- Regular. Murmurs & Other Heart Sounds:Auscultation of the heart reveals- No Murmurs.  Abdomen Inspection:-Inspeection Normal. Palpation/Percussion:Note:No mass. Palpation and Percussion of the abdomen reveal- Non Tender, Non Distended + BS, no rebound or guarding.    Neurologic Cranial Nerve exam:- CN III-XII intact(No nystagmus), symmetric smile. Strength:- 5/5 equal and symmetric strength both upper and lower extremities.  Genital exam- deferred. Rectal exam- on inspection. Rt side of rectum 3 o clock position Appearance of small non thrombosed hemorrhoid.      Assessment & Plan:  For you wellness exam today I have ordered cbc, cmp, tsh, lipid panel, ua and hiv.  Vaccine given today tdap.  Recommend exercise and healthy diet.  We will let you know lab results as they come in.  Follow up date appointment will be determined after lab review.   Please try to get old colonoscopy report so we can determine if you need one done now or if wait until 47 yo. You do appear to have hemorrhoid but still need to get old report.   Mackie Pai, PA-C

## 2018-01-30 NOTE — Patient Instructions (Addendum)
For you wellness exam today I have ordered cbc, cmp, tsh, lipid panel, ua and hiv.  Vaccine given today tdap.  Recommend exercise and healthy diet.  We will let you know lab results as they come in.  Follow up date appointment will be determined after lab review.   Please try to get old colonoscopy report so we can determine if you need one done now or if wait until 47 yo. You do appear to have hemorrhoid but still need to get old report.  Decided to refer to GI after pt explained sometimes significant amount of blood when he passes stools at times.   Preventive Care 40-64 Years, Male Preventive care refers to lifestyle choices and visits with your health care provider that can promote health and wellness. What does preventive care include?  A yearly physical exam. This is also called an annual well check.  Dental exams once or twice a year.  Routine eye exams. Ask your health care provider how often you should have your eyes checked.  Personal lifestyle choices, including: ? Daily care of your teeth and gums. ? Regular physical activity. ? Eating a healthy diet. ? Avoiding tobacco and drug use. ? Limiting alcohol use. ? Practicing safe sex. ? Taking low-dose aspirin every day starting at age 11. What happens during an annual well check? The services and screenings done by your health care provider during your annual well check will depend on your age, overall health, lifestyle risk factors, and family history of disease. Counseling Your health care provider may ask you questions about your:  Alcohol use.  Tobacco use.  Drug use.  Emotional well-being.  Home and relationship well-being.  Sexual activity.  Eating habits.  Work and work Statistician.  Screening You may have the following tests or measurements:  Height, weight, and BMI.  Blood pressure.  Lipid and cholesterol levels. These may be checked every 5 years, or more frequently if you are over 25  years old.  Skin check.  Lung cancer screening. You may have this screening every year starting at age 61 if you have a 30-pack-year history of smoking and currently smoke or have quit within the past 15 years.  Fecal occult blood test (FOBT) of the stool. You may have this test every year starting at age 61.  Flexible sigmoidoscopy or colonoscopy. You may have a sigmoidoscopy every 5 years or a colonoscopy every 10 years starting at age 79.  Prostate cancer screening. Recommendations will vary depending on your family history and other risks.  Hepatitis C blood test.  Hepatitis B blood test.  Sexually transmitted disease (STD) testing.  Diabetes screening. This is done by checking your blood sugar (glucose) after you have not eaten for a while (fasting). You may have this done every 1-3 years.  Discuss your test results, treatment options, and if necessary, the need for more tests with your health care provider. Vaccines Your health care provider may recommend certain vaccines, such as:  Influenza vaccine. This is recommended every year.  Tetanus, diphtheria, and acellular pertussis (Tdap, Td) vaccine. You may need a Td booster every 10 years.  Varicella vaccine. You may need this if you have not been vaccinated.  Zoster vaccine. You may need this after age 69.  Measles, mumps, and rubella (MMR) vaccine. You may need at least one dose of MMR if you were born in 1957 or later. You may also need a second dose.  Pneumococcal 13-valent conjugate (PCV13) vaccine. You may need  this if you have certain conditions and have not been vaccinated.  Pneumococcal polysaccharide (PPSV23) vaccine. You may need one or two doses if you smoke cigarettes or if you have certain conditions.  Meningococcal vaccine. You may need this if you have certain conditions.  Hepatitis A vaccine. You may need this if you have certain conditions or if you travel or work in places where you may be exposed to  hepatitis A.  Hepatitis B vaccine. You may need this if you have certain conditions or if you travel or work in places where you may be exposed to hepatitis B.  Haemophilus influenzae type b (Hib) vaccine. You may need this if you have certain risk factors.  Talk to your health care provider about which screenings and vaccines you need and how often you need them. This information is not intended to replace advice given to you by your health care provider. Make sure you discuss any questions you have with your health care provider. Document Released: 11/12/2015 Document Revised: 07/05/2016 Document Reviewed: 08/17/2015 Elsevier Interactive Patient Education  [employer].

## 2018-02-01 ENCOUNTER — Encounter: Payer: Self-pay | Admitting: Gastroenterology

## 2018-03-18 ENCOUNTER — Ambulatory Visit: Payer: Managed Care, Other (non HMO) | Admitting: Gastroenterology

## 2018-03-20 ENCOUNTER — Encounter: Payer: Self-pay | Admitting: Gastroenterology

## 2018-03-20 ENCOUNTER — Ambulatory Visit (INDEPENDENT_AMBULATORY_CARE_PROVIDER_SITE_OTHER): Payer: Managed Care, Other (non HMO) | Admitting: Gastroenterology

## 2018-03-20 ENCOUNTER — Encounter: Payer: Self-pay | Admitting: Internal Medicine

## 2018-03-20 VITALS — BP 132/88 | HR 76 | Ht 71.0 in | Wt 217.2 lb

## 2018-03-20 DIAGNOSIS — R1030 Lower abdominal pain, unspecified: Secondary | ICD-10-CM | POA: Diagnosis not present

## 2018-03-20 DIAGNOSIS — K625 Hemorrhage of anus and rectum: Secondary | ICD-10-CM

## 2018-03-20 DIAGNOSIS — R198 Other specified symptoms and signs involving the digestive system and abdomen: Secondary | ICD-10-CM | POA: Diagnosis not present

## 2018-03-20 MED ORDER — PEG-KCL-NACL-NASULF-NA ASC-C 140 G PO SOLR: 140.0000 g | ORAL | 0 refills | Status: DC

## 2018-03-20 NOTE — Patient Instructions (Signed)
If you are age 46 or older, your body mass index should be between 23-30. Your Body mass index is 30.3 kg/m. If this is out of the aforementioned range listed, please consider follow up with your Primary Care Provider.  If you are age 1 or younger, your body mass index should be between 19-25. Your Body mass index is 30.3 kg/m. If this is out of the aformentioned range listed, please consider follow up with your Primary Care Provider.   You have been scheduled for a colonoscopy. Please follow written instructions given to you at your visit today.  Please pick up your prep supplies at the pharmacy within the next 1-3 days. If you use inhalers (even only as needed), please bring them with you on the day of your procedure. Your physician has requested that you go to www.startemmi.com and enter the access code given to you at your visit today. This web site gives a general overview about your procedure. However, you should still follow specific instructions given to you by our office regarding your preparation for the procedure.  It was a pleasure to meet you today!  Dr. Loletha Carrow

## 2018-03-20 NOTE — Progress Notes (Signed)
[facility] Consult Note:  History: Christopher Andersen 03/20/2018  Referring physician: Mackie Pai, PA-C  Reason for consult/chief complaint: Rectal Bleeding (large amount of BRB on tissue); Blood In Stools; Rectal Pain (comes and goes); Hemorrhoids (Pt has been told he has); and Abdominal Pain (lower abdominal cramping)  Rectal bleeding  Subjective  HPI:  This is a very pleasant 47 year old man referred by primary care after recent health maintenance visit.  He described about 10 years of intermittent painless blood per rectum.  He reports having had a colonoscopy about 10 years ago and was told it was a hemorrhoid.  Romaine reports that over those last 10 years he has had intermittent painless bleeding that is usually blood on the paper, occasionally in the toilet bowl.  However, for the last 6 to 12 months he has had more severe symptoms of frequent frank rectal bleeding with bowel movements, crampy abdominal pain with urgency and "explosive bowel movements".  It would be bleeding more days than not.  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  He denies mouth sores, dysphagia, vomiting or early satiety or weight loss.  He has intermittent heartburn that seems to respond well to as needed Pepcid.  His bowel habits are often formed, but then they are semi-formed to loose during these episodes of crampy pain.  He denies joint swelling or skin rash or low back pain.  ROS:  Review of Systems  Constitutional: Negative for appetite change and unexpected weight change.  HENT: Negative for mouth sores and voice change.   Eyes: Negative for pain and redness.  Respiratory: Negative for cough and shortness of breath.   Cardiovascular: Negative for chest pain and palpitations.  Genitourinary: Negative for dysuria and hematuria.  Musculoskeletal: Negative for arthralgias and myalgias.  Skin: Negative for  pallor and rash.  Neurological: Negative for weakness and headaches.  Hematological: Negative for adenopathy.  Psychiatric/Behavioral:       He has intermittent anxiety     Past Medical History: Past Medical History:  Diagnosis Date  . Anxiety   . Depression      Past Surgical History: Past Surgical History:  Procedure Laterality Date  . APPENDECTOMY    . TONSILLECTOMY       Family History: History reviewed. No pertinent family history. No known family history of colorectal cancer or Crohn's or colitis Social History: Social History   Socioeconomic History  . Marital status: Divorced    Spouse name: Not on file  . Number of children: 8  . Years of education: Not on file  . Highest education level: Not on file  Occupational History  . Occupation: Librarian, academic- [employer]  . Financial resource strain: Not on file  . Food insecurity:    Worry: Not on file    Inability: Not on file  . Transportation needs:    Medical: Not on file    Non-medical: Not on file  Tobacco Use  . Smoking status: Never Smoker  . Smokeless tobacco: Never Used  Substance and Sexual Activity  . Alcohol use: Yes    Comment: 3-5 beers daily.  . Drug use: No  . Sexual activity: Yes  Lifestyle  . Physical activity:    Days per week: Not on file    Minutes per session: Not on file  . Stress: Not on file  Relationships  . Social connections:    Talks on phone:  Not on file    Gets together: Not on file    Attends religious service: Not on file    Active member of club or organization: Not on file    Attends meetings of clubs or organizations: Not on file    Relationship status: Not on file  Other Topics Concern  . Not on file  Social History Narrative  . Not on file    Allergies: No Known Allergies  Outpatient Meds: Current Outpatient Medications  Medication Sig Dispense Refill  . DULoxetine (CYMBALTA) 60 MG capsule Take 60 mg by mouth at bedtime.    . famotidine  (PEPCID) 10 MG tablet Take 10 mg by mouth 2 (two) times daily.    . mirtazapine (REMERON) 30 MG tablet Take 30 mg by mouth at bedtime.    Marland Kitchen PEG-KCl-NaCl-NaSulf-Na Asc-C (PLENVU) 140 g SOLR Take 140 g by mouth as directed. 1 each 0   No current facility-administered medications for this visit.       ___________________________________________________________________ Objective   Exam:  BP 132/88   Pulse 76   Ht 5\' 11"  (1.803 m)   Wt 217 lb 4 oz (98.5 kg)   BMI 30.30 kg/m    General: this is a(n) well-appearing man, good muscle mass, normal vocal quality  Eyes: sclera anicteric, no redness  ENT: oral mucosa moist without lesions, no cervical or supraclavicular lymphadenopathy, good dentition  CV: RRR without murmur, S1/S2, no JVD, no peripheral edema  Resp: clear to auscultation bilaterally, normal RR and effort noted  GI: soft, no tenderness, with active bowel sounds. No guarding or palpable organomegaly noted.  Skin; warm and dry, no rash or jaundice noted  Neuro: awake, alert and oriented x 3. Normal gross motor function and fluent speech Rectal: Normal external, no fissure or tenderness or palpable internal lesion.  Soft tan stool in the rectal vault.  Labs:  CBC Latest Ref Rng & Units 01/30/2018 01/06/2018 04/04/2014  WBC 4.0 - 10.5 K/uL 5.5 8.0 7.6  Hemoglobin 13.0 - 17.0 g/dL 15.9 15.7 15.5  Hematocrit 39.0 - 52.0 % 46.1 45.9 45.0  Platelets 150.0 - 400.0 K/uL 198.0 207 200     Assessment: Encounter Diagnoses  Name Primary?  . Rectal bleeding Yes  . Tenesmus   . Lower abdominal pain     Given the constellation of symptoms, I am concerned he could have IBD rather than just worsening internal hemorrhoids.  Plan:  Colonoscopy.  He is agreeable after discussion of procedure and risks.  The benefits and risks of the planned procedure were described in detail with the patient or (when appropriate) their health care proxy.  Risks were outlined as including, but  not limited to, bleeding, infection, perforation, adverse medication reaction leading to cardiac or pulmonary decompensation, or pancreatitis (if ERCP).  The limitation of incomplete mucosal visualization was also discussed.  No guarantees or warranties were given.  If no IBD and significant internal hemorrhoids are found, he may be a good candidate for hemorrhoidal banding.  Thank you for the courtesy of this consult.  Please call me with any questions or concerns.  [name]  CC: Mackie Pai, PA-C

## 2018-03-27 ENCOUNTER — Encounter: Payer: Self-pay | Admitting: Gastroenterology

## 2018-03-27 ENCOUNTER — Other Ambulatory Visit: Payer: Self-pay

## 2018-03-27 ENCOUNTER — Ambulatory Visit (AMBULATORY_SURGERY_CENTER): Payer: Managed Care, Other (non HMO) | Admitting: Gastroenterology

## 2018-03-27 VITALS — BP 119/85 | HR 75 | Temp 98.2°F | Resp 11 | Ht 71.0 in | Wt 217.0 lb

## 2018-03-27 DIAGNOSIS — K625 Hemorrhage of anus and rectum: Secondary | ICD-10-CM | POA: Diagnosis present

## 2018-03-27 DIAGNOSIS — D124 Benign neoplasm of descending colon: Secondary | ICD-10-CM

## 2018-03-27 MED ORDER — SODIUM CHLORIDE 0.9 % IV SOLN: 500.0000 mL | Freq: Once | INTRAVENOUS | Status: DC

## 2018-03-27 MED ORDER — HYOSCYAMINE SULFATE 0.125 MG SL SUBL: 0.1250 mg | SUBLINGUAL_TABLET | Freq: Four times a day (QID) | SUBLINGUAL | 2 refills | Status: DC | PRN

## 2018-03-27 NOTE — Progress Notes (Signed)
A and O x3. Report to RN. Tolerated MAC anesthesia well.

## 2018-03-27 NOTE — Progress Notes (Signed)
Called to room to assist during endoscopic procedure.  Patient ID and intended procedure confirmed with present staff. Received instructions for my participation in the procedure from the performing physician.  

## 2018-03-27 NOTE — Op Note (Signed)
[facility] Patient Name: Christopher Andersen Procedure Date: 03/27/2018 3:44 PM MRN: [id number] Endoscopist: Ravia. Loletha Carrow , MD Age: 47 Referring MD:  Date of Birth: 12/20/1970 Gender: Male Account #: [id number] Procedure:                Colonoscopy Indications:              Intermittent lower abdominal pain, Rectal bleeding,                            Intermittent diarrhea Medicines:                Monitored Anesthesia Care Procedure:                Pre-Anesthesia Assessment:                           - Prior to the procedure, a History and Physical                            was performed, and patient medications and                            allergies were reviewed. The patient's tolerance of                            previous anesthesia was also reviewed. The risks                            and benefits of the procedure and the sedation                            options and risks were discussed with the patient.                            All questions were answered, and informed consent                            was obtained. Prior Anticoagulants: The patient has                            taken no previous anticoagulant or antiplatelet                            agents. ASA Grade Assessment: II - A patient with                            mild systemic disease. After reviewing the risks                            and benefits, the patient was deemed in                            satisfactory condition to undergo the procedure.  After obtaining informed consent, the colonoscope                            was passed under direct vision. Throughout the                            procedure, the patient's blood pressure, pulse, and                            oxygen saturations were monitored continuously. The                            Colonoscope was introduced through the anus and                            advanced to the the cecum, identified  by                            appendiceal orifice and ileocecal valve. The                            colonoscopy was performed without difficulty. The                            patient tolerated the procedure well. The quality                            of the bowel preparation was excellent. The                            ileocecal valve, appendiceal orifice, and rectum                            were photographed. Scope In: 3:49:43 PM Scope Out: 4:05:37 PM Scope Withdrawal Time: 0 hours 11 minutes 20 seconds  Total Procedure Duration: 0 hours 15 minutes 54 seconds  Findings:                 The perianal and digital rectal examinations were                            normal.                           A 2 mm polyp was found in the proximal descending                            colon. The polyp was sessile. The polyp was removed                            with a cold biopsy forceps. Resection and retrieval                            were complete.  Internal hemorrhoids were found. The hemorrhoids                            were Grade I (internal hemorrhoids that do not                            prolapse).                           The exam was otherwise without abnormality on                            direct and retroflexion views. Complications:            No immediate complications. Estimated Blood Loss:     Estimated blood loss was minimal. Impression:               - One 2 mm polyp in the proximal descending colon,                            removed with a cold biopsy forceps. Resected and                            retrieved.                           - Internal hemorrhoids.                           - The examination was otherwise normal on direct                            and retroflexion views. Recommendation:           - Patient has a contact number available for                            emergencies. The signs and symptoms of potential                             delayed complications were discussed with the                            patient. Return to normal activities tomorrow.                            Written discharge instructions were provided to the                            patient.                           - Resume previous diet.                           - Continue present medications.                           -  Await pathology results.                           - Repeat colonoscopy is recommended for                            surveillance. The colonoscopy date will be                            determined after pathology results from today's                            exam become available for review.                           - Use Levsin, NuLev (hyoscyamine) 0.125 mg 1-2 tabs                            PO q 6 hours. Disp #45, RF 2                           - If rectal bleeding persists despite control of                            the episodic cramps/and diarrhea (that seems like                            IBS), schedule hemorrhoidal banding. Henry L. Loletha Carrow, MD 03/27/2018 4:12:34 PM This report has been signed electronically.

## 2018-03-27 NOTE — Patient Instructions (Signed)
THANK YOU FOR ALLOWING [place] TO CARE FOR YOU TODAY!  AWAIT PATHOLOGY RESULTS BY MAIL, APPROXIMATELY 2 WEEKS.  FOLLOW-UP COLONOSCOPY DEPENDENT ON POLYP TYPE.  HANDOUT GIVEN FOR POLYPS.  BEGIN LEVSIN, NULEV ( HYOSCYAMINE) 0.125 MG 1-2 TABS BY MOUTH EVERY 6 HOURS .  IF RECTAL BLEEDING PERSISTS DESPITE CONTROL OF THE EPISODIC CRAMPS AND DIARRHEA SCHEDULE HEMORROIDAL BANDING.  YOU HAD AN ENDOSCOPIC PROCEDURE TODAY AT [facility]:   Refer to the procedure report that was given to you for any specific questions about what was found during the examination.  If the procedure report does not answer your questions, please call your gastroenterologist to clarify.  If you requested that your care partner not be given the details of your procedure findings, then the procedure report has been included in a sealed envelope for you to review at your convenience later.  YOU SHOULD EXPECT: Some feelings of bloating in the abdomen. Passage of more gas than usual.  Walking can help get rid of the air that was put into your GI tract during the procedure and reduce the bloating. If you had a lower endoscopy (such as a colonoscopy or flexible sigmoidoscopy) you may notice spotting of blood in your stool or on the toilet paper. If you underwent a bowel prep for your procedure, you may not have a normal bowel movement for a few days.  Please Note:  You might notice some irritation and congestion in your nose or some drainage.  This is from the oxygen used during your procedure.  There is no need for concern and it should clear up in a day or so.  SYMPTOMS TO REPORT IMMEDIATELY:   Following lower endoscopy (colonoscopy or flexible sigmoidoscopy):  Excessive amounts of blood in the stool  Significant tenderness or worsening of abdominal pains  Swelling of the abdomen that is new, acute  Fever of 100F or higher    For urgent or emergent issues, a gastroenterologist can be reached at any hour by  calling [phone].   DIET:  We do recommend a small meal at first, but then you may proceed to your regular diet.  Drink plenty of fluids but you should avoid alcoholic beverages for 24 hours.  ACTIVITY:  You should plan to take it easy for the rest of today and you should NOT DRIVE or use heavy machinery until tomorrow (because of the sedation medicines used during the test).    FOLLOW UP: Our staff will call the number listed on your records the next business day following your procedure to check on you and address any questions or concerns that you may have regarding the information given to you following your procedure. If we do not reach you, we will leave a message.  However, if you are feeling well and you are not experiencing any problems, there is no need to return our call.  We will assume that you have returned to your regular daily activities without incident.  If any biopsies were taken you will be contacted by phone or by letter within the next 1-3 weeks.  Please call us at [phone] if you have not heard about the biopsies in 3 weeks.    SIGNATURES/CONFIDENTIALITY: You and/or your care partner have signed paperwork which will be entered into your electronic medical record.  These signatures attest to the fact that that the information above on your After Visit Summary has been reviewed and is understood.  Full responsibility of the  confidentiality of this discharge information lies with you and/or your care-partner. 

## 2018-03-28 ENCOUNTER — Telehealth: Payer: Self-pay

## 2018-03-28 ENCOUNTER — Telehealth: Payer: Self-pay | Admitting: *Deleted

## 2018-03-28 NOTE — Telephone Encounter (Signed)
No answer. No identifier. Message left to call if questions or concerns. 

## 2018-03-28 NOTE — Telephone Encounter (Signed)
  Follow up Call-  Call back number 03/27/2018  Post procedure Call Back phone  # [phone]  Permission to leave phone message Yes  Some recent data might be hidden    Reached voicemail.  No ID.  No message left.  Will try again this afternoon. Jemila Camille/Call-back [facility]

## 2018-03-31 ENCOUNTER — Encounter: Payer: Self-pay | Admitting: Gastroenterology

## 2019-06-04 DIAGNOSIS — M4722 Other spondylosis with radiculopathy, cervical region: Secondary | ICD-10-CM | POA: Diagnosis not present

## 2019-06-04 DIAGNOSIS — M542 Cervicalgia: Secondary | ICD-10-CM | POA: Diagnosis not present

## 2020-05-10 ENCOUNTER — Other Ambulatory Visit: Payer: Self-pay

## 2020-05-10 ENCOUNTER — Ambulatory Visit (INDEPENDENT_AMBULATORY_CARE_PROVIDER_SITE_OTHER): Payer: BC Managed Care – PPO | Admitting: Family Medicine

## 2020-05-10 ENCOUNTER — Encounter: Payer: Self-pay | Admitting: Family Medicine

## 2020-05-10 VITALS — BP 122/86 | HR 90 | Temp 98.5°F | Resp 18 | Ht 71.0 in | Wt 198.0 lb

## 2020-05-10 DIAGNOSIS — M7711 Lateral epicondylitis, right elbow: Secondary | ICD-10-CM

## 2020-05-10 DIAGNOSIS — K219 Gastro-esophageal reflux disease without esophagitis: Secondary | ICD-10-CM

## 2020-05-10 MED ORDER — PREDNISONE 10 MG PO TABS: ORAL_TABLET | ORAL | 0 refills | Status: DC

## 2020-05-10 MED ORDER — OMEPRAZOLE 20 MG PO CPDR: 20.0000 mg | DELAYED_RELEASE_CAPSULE | Freq: Every day | ORAL | 3 refills | Status: DC

## 2020-05-10 NOTE — Patient Instructions (Signed)
Tennis Elbow    Tennis elbow (lateral epicondylitis) is inflammation of tendons in your outer forearm, near your elbow. Tendons are tissues that connect muscle to bone. When you have tennis elbow, inflammation affects the tendons that you use to bend your wrist and move your hand up. Inflammation occurs in the lower part of the upper arm bone (humerus), where the tendons connect to the bone (lateral epicondyle).  Tennis elbow often affects people who play tennis, but anyone may get the condition from repeatedly extending the wrist or turning the forearm.  What are the causes?  This condition is usually caused by repeatedly extending the wrist, turning the forearm, and using the hands. It can result from sports or work that requires repetitive forearm movements. In some cases, it may be caused by a sudden injury.  What increases the risk?  You are more likely to develop tennis elbow if you play tennis or another racket sport. You also have a higher risk if you frequently use your hands for work. Besides people who play tennis, others at greater risk include:  Musicians.  Carpenters, painters, and plumbers.  Cooks.  Cashiers.  People who work in factories.  Construction workers.  Butchers.  People who use computers.  What are the signs or symptoms?  Symptoms of this condition include:  Pain and tenderness in the forearm and the outer part of the elbow. Pain may be felt only when using the arm, or it may be there all the time.  A burning feeling that starts in the elbow and spreads down the forearm.  A weak grip in the hand.  How is this diagnosed?  This condition may be diagnosed based on:  Your symptoms and medical history.  A physical exam.  X-rays.  MRI.  How is this treated?  Resting and icing your arm is often the first treatment. Your health care provider may also recommend:  Medicines to reduce pain and inflammation. These may be in the form of a pill, topical gels, or shots of a steroid medicine  (cortisone).  An elbow strap to reduce stress on the area.  Physical therapy. This may include massage or exercises.  An elbow brace to restrict the movements that cause symptoms.  If these treatments do not help relieve your symptoms, your health care provider may recommend surgery to remove damaged muscle and reattach healthy muscle to bone.  Follow these instructions at home:  Activity  Rest your elbow and wrist and avoid activities that cause symptoms, as told by your health care provider.  Do physical therapy exercises as instructed.  If you lift an object, lift it with your palm facing up. This reduces stress on your elbow.  Lifestyle  If your tennis elbow is caused by sports, check your equipment and make sure that:  You are using it correctly.  It is the best fit for you.  If your tennis elbow is caused by work or computer use, take frequent breaks to stretch your arm. Talk with your manager about ways to manage your condition at work.  If you have a brace:  Wear the brace or strap as told by your health care provider. Remove it only as told by your health care provider.  Loosen the brace if your fingers tingle, become numb, or turn cold and blue.  Keep the brace clean.  If the brace is not waterproof, ask if you may remove it for bathing. If you must keep the brace on while bathing:    bath or a shower. General instructions   If directed, put ice on the painful area: ? Put ice in a plastic bag. ? Place a towel between your skin and the bag. ? Leave the ice on for 20 minutes, 2-3 times a day.  Take over-the-counter and prescription medicines only as told by your health care provider.  Keep all follow-up visits as told by your health care provider. This is important. Contact a health care provider if:  You have pain that gets worse or does not get better with  treatment.  You have numbness or weakness in your forearm, hand, or fingers. Summary  Tennis elbow (lateral epicondylitis) is inflammation of tendons in your outer forearm, near your elbow.  Common symptoms include pain and tenderness in your forearm and the outer part of your elbow.  This condition is usually caused by repeatedly extending your wrist, turning your forearm, and using your hands.  The first treatment is often resting and icing your arm to relieve symptoms. Further treatment may include taking medicine, getting physical therapy, wearing a brace or strap, or having surgery. This information is not intended to replace advice given to you by your health care provider. Make sure you discuss any questions you have with your health care provider. Document Revised: 07/12/2018 Document Reviewed: 07/31/2017 Elsevier Patient Education  2020 Elsevier Inc.  

## 2020-05-10 NOTE — Progress Notes (Signed)
atient ID: Christopher Andersen, male    DOB: 05/18/1971  Age: 49 y.o. MRN: [id number]    Subjective:  Subjective  HPI Christopher Andersen presents for R elbow pain -- pt is r handed and remembers using an axe a few months ago beating roots out of the ground--- 1-2 days later it started and progressively worsening   Review of Systems  Constitutional: Negative for appetite change, diaphoresis, fatigue and unexpected weight change.  Eyes: Negative for pain, redness and visual disturbance.  Respiratory: Negative for cough, chest tightness, shortness of breath and wheezing.   Cardiovascular: Negative for chest pain, palpitations and leg swelling.  Endocrine: Negative for cold intolerance, heat intolerance, polydipsia, polyphagia and polyuria.  Genitourinary: Negative for difficulty urinating, dysuria and frequency.  Musculoskeletal: Positive for arthralgias, joint swelling and myalgias.  Neurological: Negative for dizziness, light-headedness, numbness and headaches.    History Past Medical History:  Diagnosis Date  . Anxiety   . Depression     He has a past surgical history that includes Tonsillectomy.   His family history is not on file.He reports that he has never smoked. He has never used smokeless tobacco. He reports current alcohol use. He reports that he does not use drugs.  Current Outpatient Medications on File Prior to Visit  Medication Sig Dispense Refill  . DULoxetine (CYMBALTA) 60 MG capsule Take 60 mg by mouth at bedtime. (Patient not taking: Reported on 05/10/2020)     Current Facility-Administered Medications on File Prior to Visit  Medication Dose Route Frequency Provider Last Rate Last Admin  . 0.9 %  sodium chloride infusion  500 mL Intravenous Once Doran Stabler, MD         Objective:  Objective  Physical Exam Vitals and nursing note reviewed.    Constitutional:      General: He is sleeping.     Appearance: He is well-developed.  HENT:     Head: Normocephalic and atraumatic.  Eyes:     Pupils: Pupils are equal, round, and reactive to light.  Neck:     Thyroid: No thyromegaly.  Cardiovascular:     Rate and Rhythm: Normal rate and regular rhythm.     Heart sounds: No murmur heard.   Pulmonary:     Effort: Pulmonary effort is normal. No respiratory distress.     Breath sounds: Normal breath sounds. No wheezing or rales.  Chest:     Chest wall: No tenderness.  Musculoskeletal:        General: Swelling and tenderness present.     Right elbow: Swelling present. Tenderness present in radial head.     Left elbow: Normal.     Cervical back: Normal range of motion and neck supple.  Skin:    General: Skin is warm and dry.  Neurological:     Mental Status: He is oriented to person, place, and time.  Psychiatric:        Behavior: Behavior normal.        Thought Content: Thought content normal.        Judgment: Judgment normal.    BP 122/86 (BP Location: Left Arm, Patient Position: Sitting,  Cuff Size: Normal)   Pulse 90   Temp 98.5 F (36.9 C) (Temporal)   Resp 18   Ht 5\' 11"  (1.803 m)   Wt 198 lb (89.8 kg)   SpO2 99%   BMI 27.62 kg/m  Wt Readings from Last 3 Encounters:  05/10/20 198 lb (89.8 kg)  03/27/18 217 lb (98.4 kg)  03/20/18 217 lb 4 oz (98.5 kg)     Lab Results  Component Value Date   WBC 5.5 01/30/2018   HGB 15.9 01/30/2018   HCT 46.1 01/30/2018   PLT 198.0 01/30/2018   GLUCOSE 99 01/30/2018   CHOL 233 (H) 01/30/2018   TRIG 297.0 (H) 01/30/2018   HDL 53.50 01/30/2018   LDLDIRECT 135.0 01/30/2018   ALT 64 (H) 01/30/2018   AST 38 (H) 01/30/2018   NA 138 01/30/2018   K 4.5 01/30/2018   CL 100 01/30/2018   CREATININE 1.24 01/30/2018   BUN 22 01/30/2018   CO2 30 01/30/2018    DG Chest 2 View  Result Date: 01/06/2018 CLINICAL DATA:  Chest pain. EXAM: CHEST - 2 VIEW COMPARISON:  April 04, 2014  FINDINGS: The heart size and mediastinal contours are within normal limits. Both lungs are clear. The visualized skeletal structures are unremarkable. IMPRESSION: No active cardiopulmonary disease. Electronically Signed   By: Dorise Bullion III M.D   On: 01/06/2018 19:19     Assessment & Plan:  Plan  I have discontinued Christopher Andersen. Christopher Andersen's mirtazapine, famotidine, and hyoscyamine. I am also having him start on omeprazole and predniSONE. Additionally, I am having him maintain his DULoxetine. We will continue to administer sodium chloride.  Meds ordered this encounter  Medications  . omeprazole (PRILOSEC) 20 MG capsule    Sig: Take 1 capsule (20 mg total) by mouth daily.    Dispense:  30 capsule    Refill:  3  . predniSONE (DELTASONE) 10 MG tablet    Sig: TAKE 3 TABLETS PO QD FOR 3 DAYS THEN TAKE 2 TABLETS PO QD FOR 3 DAYS THEN TAKE 1 TABLET PO QD FOR 3 DAYS THEN TAKE 1/2 TAB PO QD FOR 3 DAYS    Dispense:  20 tablet    Refill:  0    Problem List Items Addressed This Visit      Unprioritized   Lateral epicondylitis of right elbow - Primary    Ice elbow 15-20 min daily Tennis elbow strap  Consider sport med referral       Relevant Medications   predniSONE (DELTASONE) 10 MG tablet    Other Visit Diagnoses    Gastroesophageal reflux disease, unspecified whether esophagitis present       Relevant Medications   omeprazole (PRILOSEC) 20 MG capsule      Follow-up: Return if symptoms worsen or fail to improve.  Ann Held, DO

## 2020-05-10 NOTE — Assessment & Plan Note (Signed)
Ice elbow 15-20 min daily Tennis elbow strap  Consider sport med referral

## 2020-05-20 ENCOUNTER — Telehealth: Payer: Self-pay | Admitting: Medical

## 2020-05-20 DIAGNOSIS — M7711 Lateral epicondylitis, right elbow: Secondary | ICD-10-CM

## 2020-05-20 NOTE — Telephone Encounter (Signed)
Ortho is fine

## 2020-05-20 NOTE — Telephone Encounter (Signed)
Patient states that he arm still hurt (right) . Patient was told to call back in if needed a referral to ortho, Patient is requesting a referral   Please Advise

## 2020-05-20 NOTE — Telephone Encounter (Signed)
Patient states ortho however your note mentions sports medicine? Which would you prefer he see?

## 2020-05-20 NOTE — Telephone Encounter (Signed)
Ortho referral has been placed. Patient notified.

## 2020-05-24 ENCOUNTER — Ambulatory Visit: Payer: BC Managed Care – PPO | Admitting: Family Medicine

## 2020-05-25 ENCOUNTER — Ambulatory Visit (INDEPENDENT_AMBULATORY_CARE_PROVIDER_SITE_OTHER): Payer: BC Managed Care – PPO | Admitting: Orthopaedic Surgery

## 2020-05-25 ENCOUNTER — Encounter: Payer: Self-pay | Admitting: Orthopaedic Surgery

## 2020-05-25 ENCOUNTER — Ambulatory Visit: Payer: Self-pay

## 2020-05-25 DIAGNOSIS — M7711 Lateral epicondylitis, right elbow: Secondary | ICD-10-CM

## 2020-05-25 DIAGNOSIS — M25521 Pain in right elbow: Secondary | ICD-10-CM

## 2020-05-25 NOTE — Progress Notes (Signed)
Office Visit Note   Patient: Christopher Andersen           Date of Birth: 1971-08-26           MRN: [id number] Visit Date: 05/25/2020              Requested by: [address] [place], [place] [address] [place],  [place] [address] PCP: Mackie Pai, PA-C   Assessment & Plan: Visit Diagnoses:  1. Pain in right elbow   2. Lateral epicondylitis, right elbow     Plan: The signs and symptoms of his elbow are consistent with lateral epicondylitis.  I did offer steroid injection but since he is doing better working to defer this.  I showed him stretching exercises to try and we talked about activity modification.  He is already trying Voltaren gel.  All questions and concerns were answered and addressed.  If this is not improving he desires to try a steroid injection he will let us know.  Follow-Up Instructions: Return if symptoms worsen or fail to improve.   Orders:  Orders Placed This Encounter  Procedures  . XR Elbow 2 Views Right   No orders of the defined types were placed in this encounter.     Procedures: No procedures performed   Clinical Data: No additional findings.   Subjective: Chief Complaint  Patient presents with  . Right Elbow - Pain  The patient is a very pleasant right-hand-dominant 49 year old gentleman who comes in for evaluation treatment of right elbow pain.  He points over the lateral epicondyle area as a source of his pain.  It started about 3 months ago with no known injury.  He is just using a pick ax outside and not cause the pain.  He does do a lot of computer work and is on a mouse all day long.  He is just recently modified how he is using the mouse.  His primary care physician did put him on a oral steroid taper and that was really helpful to him.  He denies any numbness and tingling or any other injuries.  He is now diabetic and not a smoker.  He has no acute changes in his medical status.  HPI  Review of Systems There is currently  no headache, chest pain, shortness of breath, fever, chills, nausea, vomiting  Objective: Vital Signs: There were no vitals taken for this visit.  Physical Exam He is alert and oriented x3 and in no acute distress Ortho Exam Examination of his right elbow shows pain to palpation over the lateral epicondyle area and just distal to this along the course of the extensor tendon insertion site.  His range of motion is full.  He has a negative Tinel's sign at the cubital tunnel.  He has excellent strength in the right elbow and is ligamentously stable. Specialty Comments:  No specialty comments available.  Imaging: XR Elbow 2 Views Right  Result Date: 05/25/2020 2 views of the right elbow show no acute findings.    PMFS History: Patient Active Problem List   Diagnosis Date Noted  . Lateral epicondylitis of right elbow 05/10/2020  . Anxiety 01/15/2018   Past Medical History:  Diagnosis Date  . Anxiety   . Depression     History reviewed. No pertinent family history.  Past Surgical History:  Procedure Laterality Date  . TONSILLECTOMY     Social History   Occupational History  . Occupation: Education officer, community  Tobacco  Use  . Smoking status: Never Smoker  . Smokeless tobacco: Never Used  Vaping Use  . Vaping Use: Never used  Substance and Sexual Activity  . Alcohol use: Yes    Comment: 3-5 beers daily.  . Drug use: No  . Sexual activity: Yes

## 2020-06-11 DIAGNOSIS — F4321 Adjustment disorder with depressed mood: Secondary | ICD-10-CM | POA: Diagnosis not present

## 2020-07-15 DIAGNOSIS — F4321 Adjustment disorder with depressed mood: Secondary | ICD-10-CM | POA: Diagnosis not present

## 2020-07-29 DIAGNOSIS — F4321 Adjustment disorder with depressed mood: Secondary | ICD-10-CM | POA: Diagnosis not present

## 2020-08-19 DIAGNOSIS — F4321 Adjustment disorder with depressed mood: Secondary | ICD-10-CM | POA: Diagnosis not present

## 2020-11-03 ENCOUNTER — Encounter: Payer: Self-pay | Admitting: Physician Assistant

## 2020-11-03 ENCOUNTER — Ambulatory Visit: Payer: Self-pay

## 2020-11-03 ENCOUNTER — Ambulatory Visit (INDEPENDENT_AMBULATORY_CARE_PROVIDER_SITE_OTHER): Payer: BC Managed Care – PPO | Admitting: Physician Assistant

## 2020-11-03 DIAGNOSIS — M542 Cervicalgia: Secondary | ICD-10-CM

## 2020-11-03 DIAGNOSIS — M549 Dorsalgia, unspecified: Secondary | ICD-10-CM | POA: Diagnosis not present

## 2020-11-03 MED ORDER — CYCLOBENZAPRINE HCL 10 MG PO TABS: 10.0000 mg | ORAL_TABLET | Freq: Every day | ORAL | 0 refills | Status: DC

## 2020-11-03 NOTE — Progress Notes (Signed)
Office Visit Note   Patient: Christopher Andersen           Date of Birth: [birth year]           MRN: [id number] Visit Date: 11/03/2020              Requested by: Esperanza Richters, PA-C [address] [place],  [place] [address] PCP: Esperanza Richters, PA-C   Assessment & Plan: Visit Diagnoses:  1. Neck pain   2. Mid back pain     Plan: We will start with physical therapy to work on range of motion and strengthening of his cervical and thoracic spine there to include modalities.  Also start him on Flexeril at night.  He will follow up with [place] in a month to see what type of response he had to therapy on his neck.  If his symptoms become worse he will return sooner.  May consider MRI future if his cervical and thoracic spine pain continues.  Follow-Up Instructions: Return in about 4 weeks (around 12/01/2020).   Orders:  Orders Placed This Encounter  Procedures  . XR Cervical Spine 2 or 3 views  . XR Thoracic Spine 2 View  . XR Cervical Spine 2 or 3 views   Meds ordered this encounter  Medications  . cyclobenzaprine (FLEXERIL) 10 MG tablet    Sig: Take 1 tablet (10 mg total) by mouth at bedtime.    Dispense:  30 tablet    Refill:  0      Procedures: No procedures performed   Clinical Data: No additional findings.   Subjective: Chief Complaint  Patient presents with  . Middle Back - Pain  . Neck - Pain    HPI Christopher Andersen is a 50 year old male well-known by department service comes in today due to neck pain and numbness tingling down the right arm to the elbow that is been ongoing for approximately a month and a half.  He reports in November he went to [employer] and rode a lot of rides and started having pain in his neck he can numbness tingling down the arm after writing several roller coasters.  Also noted a new clicking pain with range of motion of his cervical spine.  Pain does awaken him at night.  Denies any shoulder pain.  He is also having some  midthoracic pain.  No numbness tingling down the left arm.  He has pain between the shoulder blades. Reports that his right elbow pain is better he is using Voltaren gel on it.  He had been diagnosed previously with right lateral epicondylitis.  Been treated with an oral steroid in October for this.  Review of Systems See HPI otherwise negative  Objective: Vital Signs: There were no vitals taken for this visit.  Physical Exam Constitutional:      Appearance: He is not ill-appearing or diaphoretic.  Pulmonary:     Effort: Pulmonary effort is normal.  Neurological:     Mental Status: He is alert and oriented to person, place, and time.  Psychiatric:        Mood and Affect: Mood normal.     Ortho Exam Cervical spine positive Spurling's.  Good range of motion cervical spine extension of the cervical spine causes pain in the right arm.  5 out of 5 strength throughout the upper extremities against resistance.  Full motor/ full sensation bilateral hands.  Tenderness over the right elbow lateral epicondyle.  Provocative maneuvers right wrist causes  no pain at the elbow. Specialty Comments:  No specialty comments available.  Imaging: No results found.   PMFS History: Patient Active Problem List   Diagnosis Date Noted  . Lateral epicondylitis of right elbow 05/10/2020  . Anxiety 01/15/2018   Past Medical History:  Diagnosis Date  . Anxiety   . Depression     History reviewed. No pertinent family history.  Past Surgical History:  Procedure Laterality Date  . TONSILLECTOMY     Social History   Occupational History  . Occupation: Clinical biochemist  Tobacco Use  . Smoking status: Never Smoker  . Smokeless tobacco: Never Used  Vaping Use  . Vaping Use: Never used  Substance and Sexual Activity  . Alcohol use: Yes    Comment: 3-5 beers daily.  . Drug use: No  . Sexual activity: Yes

## 2020-11-04 NOTE — Addendum Note (Signed)
Addended by: Barbette Or on: 11/04/2020 08:14 AM   Modules accepted: Orders

## 2020-11-15 ENCOUNTER — Ambulatory Visit: Payer: BC Managed Care – PPO | Admitting: Physical Therapy

## 2020-11-17 ENCOUNTER — Ambulatory Visit: Payer: BC Managed Care – PPO | Attending: Physician Assistant | Admitting: Physical Therapy

## 2020-11-17 ENCOUNTER — Other Ambulatory Visit: Payer: Self-pay

## 2020-11-17 ENCOUNTER — Encounter: Payer: Self-pay | Admitting: Physical Therapy

## 2020-11-17 DIAGNOSIS — M62838 Other muscle spasm: Secondary | ICD-10-CM | POA: Diagnosis not present

## 2020-11-17 DIAGNOSIS — M542 Cervicalgia: Secondary | ICD-10-CM | POA: Insufficient documentation

## 2020-11-17 DIAGNOSIS — R293 Abnormal posture: Secondary | ICD-10-CM | POA: Insufficient documentation

## 2020-11-17 NOTE — Therapy (Signed)
[facility] [address]  [place], [place], [address] Phone: [phone]   Fax:  [phone]  Physical Therapy Evaluation  Patient Details  Name: Christopher Andersen MRN: [id number] Date of Birth: 1971-03-14 Referring Provider (PT): Erskine Emery, PA-C   Encounter Date: 11/17/2020   PT End of Session - 11/17/20 1153    Visit Number 1    Number of Visits 7    Date for PT Re-Evaluation 12/29/20    Authorization Type BCBS    PT Start Time 1054    PT Stop Time 1144    PT Time Calculation (min) 50 min    Activity Tolerance Patient tolerated treatment well;Patient limited by pain    Behavior During Therapy [facility] for tasks assessed/performed           Past Medical History:  Diagnosis Date  . Anxiety   . Depression     Past Surgical History:  Procedure Laterality Date  . TONSILLECTOMY      There were no vitals filed for this visit.    Subjective Assessment - 11/17/20 1055    Subjective Patient reports pain in the midback which has now evolved to N/T down the R arm to the elbow. Also notes N/T down the R arm to the elbow, worse when raising the arm overhead. Pain has been evident for 1 year with progressive worsening. Tried chiropractic for 5-6 visits without relief. Notes a hx of elbow tendonitis which was improved with steroids. Also noting clicking in the neck with movement. Worse with R and L rotation/side bending. Better with stretching and self-massage to the midback as well as meds. Reports that he sits looking at 3 monitors all day at work and unsure if he has the best posture. Also reports increased emotional stress d/t work and family matters.    Pertinent History depression, anxiety    Limitations Lifting;House hold activities    How long can you sit comfortably? unlimited    How long can you stand comfortably? unlimited    How long can you walk comfortably? unlimited    Diagnostic tests none recent     Patient Stated Goals decrease pain    Currently in Pain? Yes    Pain Score 1     Pain Location Back    Pain Orientation Right;Mid    Pain Descriptors / Indicators Dull    Pain Type Chronic pain              [facility] PT Assessment - 11/17/20 1102      Assessment   Medical Diagnosis Neck pain, Midback pain    Referring Provider (PT) Erskine Emery, PA-C    Onset Date/Surgical Date 11/18/19    Hand Dominance Right    Next MD Visit 12/02/20    Prior Therapy no      Balance Screen   Has the patient fallen in the past 6 months No    Has the patient had a decrease in activity level because of a fear of falling?  No    Is the patient reluctant to leave their home because of a fear of falling?  No      Home Environment   Living Environment Private residence    Living Arrangements Other relatives    Available Help at Discharge Family    Type of [place]      Prior Function   Level of Independence Independent    Vocation Full time employment  Vocation Requirements computer work- looking at [employer]; notes increased work stress    Leisure yard work, [employer] Status [employer] for tasks assessed      Sensation   Light Touch Appears Intact      Coordination   Gross Motor Movements are Fluid and Coordinated Yes      Posture/Postural Control   Posture/Postural Control Postural limitations    Postural Limitations Rounded Shoulders;Forward head      ROM / Strength   AROM / PROM / Strength AROM;Strength      AROM   AROM Assessment Site Cervical    Cervical Flexion 45   discomfort   Cervical Extension 64   discomfort   Cervical - Right Side Bend 45   c/o R UE N/T; pain and c/o popping   Cervical - Left Side Bend 40   pain and c/o popping   Cervical - Right Rotation 55   pain on return to neutral   Cervical - Left Rotation 63   pain on return to neutral     Strength   Strength Assessment Site Shoulder;Elbow;Wrist;Hand     Right/Left Shoulder Right;Left    Right Shoulder Flexion 4+/5   pain in R scapula   Right Shoulder ABduction 4+/5   pain in R scapula   Right Shoulder Internal Rotation 4+/5    Right Shoulder External Rotation 4/5    Left Shoulder Flexion 4+/5    Left Shoulder ABduction 4+/5    Left Shoulder Internal Rotation 4+/5    Left Shoulder External Rotation 4/5    Right/Left Elbow Right;Left    Right Elbow Flexion 4/5    Right Elbow Extension 4+/5    Left Elbow Flexion 4+/5    Left Elbow Extension 4+/5    Right/Left Wrist Right;Left    Right Wrist Flexion 4+/5   c/o forearm spasm   Right Wrist Extension 4+/5    Left Wrist Flexion 4+/5    Left Wrist Extension 4+/5    Right/Left hand Right;Left    Right Hand Grip (lbs) 43   44, 42, 43   Left Hand Grip (lbs) 36.67   40, 35, 35     Palpation   Palpation comment no TTP over B cervical and shoulder musculature; considerable soft tissue restriction in B UT, LS, scalenes, rhomboids and thoracic paraspinals, pecs; R>L      Special Tests    Special Tests Cervical    Cervical Tests Spurling's;Dictraction      Spurling's   Findings Positive    Side Left    Comment positive for N/T on R UE      Distraction Test   Findngs Negative                      Objective measurements completed on examination: See above findings.               PT Education - 11/17/20 1153    Education Details prognosis, POC, HEP; edu on correction of posture at work    [employer]) Educated Patient    Methods Explanation;Demonstration;Tactile cues;Verbal cues;Handout    Comprehension Verbalized understanding;Returned demonstration            PT Short Term Goals - 11/17/20 1200      PT SHORT TERM GOAL #1   Title Patient to be independent with initial HEP.    Time 2    Period Weeks  Status New    Target Date 12/01/20             PT Long Term Goals - 11/17/20 1200      PT LONG TERM GOAL #1   Title Patient to be independent with  advanced HEP.    Time 6    Period Weeks    Status New    Target Date 12/29/20      PT LONG TERM GOAL #2   Title Patient to demonstrate B UE strength >/=4+/5.    Time 6    Period Weeks    Status New    Target Date 12/29/20      PT LONG TERM GOAL #3   Title Patient to demonstrate cervical AROM WFL and without pain limiting.    Time 6    Period Weeks    Status New    Target Date 12/29/20      PT LONG TERM GOAL #4   Title Patient to demonstrate and recall correction of posture at rest and with activity.    Time 6    Period Weeks    Status New    Target Date 12/29/20      PT LONG TERM GOAL #5   Title Patient to report 70% improvement in pain.    Time 6    Period Weeks    Status New    Target Date 12/29/20                  Plan - 11/17/20 1153    Clinical Impression Statement Patient is a 50 y/o M presenting to OPPT with c/o chronic cervical and midback pain for the past year. Pain and N/T occurs from the R midback, to the superior shoulder, with radiation down to the elbow. Patient also reports a hx of R elbow tendonitis but this is resolved. Pain worse with rotation and side bending. Patient's work days involve prolonged computer work and also notes increased family and work stress, which may be contributing to chronicity and intensity of pain. Patient today presenting with rounded and forward head posture, limited and painful cervical AROM, slightly decreased shoulder IR and elbow flexion strength, soft tissue restriction in B UT, LS, scalenes, rhomboids and thoracic paraspinals, pecs; R>L, and positive L Spurling's. Patient was educated on gentle postural correction and strengthening HEP as well as edu on DN- patient reported understanding. Would benefit from skilled PT services 1x/week for 6 weeks to address aforementioned impairments.    Personal Factors and Comorbidities Age;Behavior Pattern;Comorbidity 2;Past/Current Experience;Profession;Time since onset of  injury/illness/exacerbation    Comorbidities depression, anxiety    Examination-Participation Restrictions Church;Cleaning;Shop;Community Activity;Driving;Yard Work;Laundry;Meal Prep;Occupation    Stability/Clinical Decision Making Evolving/Moderate complexity    Clinical Decision Making Moderate    Rehab Potential Good    PT Frequency 1x / week    PT Duration 6 weeks    PT Treatment/Interventions ADLs/Self Care Home Management;Cryotherapy;Electrical Stimulation;Moist Heat;Traction;Balance training;Therapeutic exercise;Therapeutic activities;Ultrasound;Neuromuscular re-education;Patient/family education;Manual techniques;Vasopneumatic Device;Energy conservation;Dry needling;Passive range of motion    PT Next Visit Plan cervical FOTO; reassess HEP, progress postural correction ther-ex, STM    Consulted and Agree with Plan of Care Patient           Patient will benefit from skilled therapeutic intervention in order to improve the following deficits and impairments:  Hypomobility,Decreased activity tolerance,Decreased strength,Increased fascial restricitons,Pain,Increased muscle spasms,Improper body mechanics,Decreased range of motion,Impaired flexibility,Postural dysfunction  Visit Diagnosis: Cervicalgia  Abnormal posture  Other muscle spasm     Problem  List Patient Active Problem List   Diagnosis Date Noted  . Lateral epicondylitis of right elbow 05/10/2020  . Anxiety 01/15/2018     Janene Harvey, PT, DPT 11/17/20 12:06 PM   [facility] [address]  [place], [place], [address] Phone: [phone]   Fax:  [phone]  Name: GOPAL MELODY MRN: [id number] Date of Birth: [birth year]

## 2020-11-17 NOTE — Patient Instructions (Addendum)

## 2020-11-18 DIAGNOSIS — Z20822 Contact with and (suspected) exposure to covid-19: Secondary | ICD-10-CM | POA: Diagnosis not present

## 2020-11-26 ENCOUNTER — Other Ambulatory Visit: Payer: Self-pay

## 2020-11-26 ENCOUNTER — Ambulatory Visit: Payer: BC Managed Care – PPO | Admitting: Physical Therapy

## 2020-11-26 DIAGNOSIS — R293 Abnormal posture: Secondary | ICD-10-CM

## 2020-11-26 DIAGNOSIS — M62838 Other muscle spasm: Secondary | ICD-10-CM | POA: Diagnosis not present

## 2020-11-26 DIAGNOSIS — M542 Cervicalgia: Secondary | ICD-10-CM

## 2020-11-26 NOTE — Therapy (Signed)
[facility] [address]  [place], [place], [address] Phone: [phone]   Fax:  [phone]  Physical Therapy Treatment  Patient Details  Name: Christopher Andersen MRN: [id number] Date of Birth: 03-16-1971 Referring Provider (PT): Erskine Emery, PA-C   Encounter Date: 11/26/2020   PT End of Session - 11/26/20 1102    Visit Number 2    Number of Visits 7    Date for PT Re-Evaluation 12/29/20    Authorization Type BCBS    PT Start Time 1021    PT Stop Time 1108    PT Time Calculation (min) 47 min    Activity Tolerance Patient tolerated treatment well;Patient limited by pain    Behavior During Therapy [facility] for tasks assessed/performed           Past Medical History:  Diagnosis Date  . Anxiety   . Depression     Past Surgical History:  Procedure Laterality Date  . TONSILLECTOMY      There were no vitals filed for this visit.   Subjective Assessment - 11/26/20 1023    Subjective "Last night was a rough night", difficult getting comfortable.  He is doing exercises 1x/day.    Currently in Pain? Yes    Pain Score 4     Pain Location Back    Pain Orientation Right    Pain Descriptors / Indicators Constant;Radiating    Aggravating Factors  nighttime, sidesleeping. bad posture    Pain Relieving Factors changing positions              [facility] PT Assessment - 11/26/20 0001      Assessment   Medical Diagnosis Neck pain, Midback pain    Referring Provider (PT) Erskine Emery, PA-C    Onset Date/Surgical Date 11/18/19    Hand Dominance Right    Next MD Visit 12/02/20    Prior Therapy no           [facility] Adult PT Treatment/Exercise - 11/26/20 0001      Self-Care   Self-Care Posture;Other Self-Care Comments    Posture educated pt regarding neutral spine and utilizing lumbar support to maintain normal curves of spine while driving/ at work.    Other Self-Care Comments  pt educated on rationale and  application of TENS unit.  Pt verbalized understanding.      Shoulder Exercises: Seated   Row Both;10 reps;Strengthening    Theraband Level (Shoulder Row) Level 2 (Red)    Row Limitations cues for form, to decrease contraction and over arching of spine.    External Rotation Both;10 reps;Strengthening   3 sec hold in retraction   Theraband Level (Shoulder External Rotation) Level 2 (Red)      Shoulder Exercises: Standing   Other Standing Exercises chin tucks x 3 sec x 5 reps - multiple cues for not overdoing it and for more gentle form.  scap squeeze x 5 sec x 5 reps      Shoulder Exercises: ROM/Strengthening   UBE (Upper Arm Bike) L1-3: 1 min forward, 1 min backward, 1 min forward.   pt reported tingling in Rt elbow at end of 3rd min     Shoulder Exercises: Stretch   Other Shoulder Stretches bilat midlevel doorway stretch x 20 sec x 2 reps with cues for upright posture and neutral head postion. Rt high level doorway stretch x 15 sec x 2 reps.    Other Shoulder Stretches seated thoracic ext over back of  chair with hands supporting head x 5 sec, radicular symptoms occured/ stopped when arm at side.      Modalities   Modalities Electrical Stimulation;Moist Heat      Moist Heat Therapy   Number Minutes Moist Heat 10 Minutes    Moist Heat Location --   thoracic     Electrical Stimulation   Electrical Stimulation Location Rt upper thoracic paraspinals and traps    Electrical Stimulation Action TENS    Electrical Stimulation Parameters intensity to tolerance (pt liked burst mode) x 10 min    Electrical Stimulation Goals Pain                    PT Short Term Goals - 11/17/20 1200      PT SHORT TERM GOAL #1   Title Patient to be independent with initial HEP.    Time 2    Period Weeks    Status New    Target Date 12/01/20             PT Long Term Goals - 11/17/20 1200      PT LONG TERM GOAL #1   Title Patient to be independent with advanced HEP.    Time 6     Period Weeks    Status New    Target Date 12/29/20      PT LONG TERM GOAL #2   Title Patient to demonstrate B UE strength >/=4+/5.    Time 6    Period Weeks    Status New    Target Date 12/29/20      PT LONG TERM GOAL #3   Title Patient to demonstrate cervical AROM WFL and without pain limiting.    Time 6    Period Weeks    Status New    Target Date 12/29/20      PT LONG TERM GOAL #4   Title Patient to demonstrate and recall correction of posture at rest and with activity.    Time 6    Period Weeks    Status New    Target Date 12/29/20      PT LONG TERM GOAL #5   Title Patient to report 70% improvement in pain.    Time 6    Period Weeks    Status New    Target Date 12/29/20                 Plan - 11/26/20 1436    Clinical Impression Statement HEP reviewed; Pt required frequent cues to dial back the intensity of exercises and for form.  Pt reported some tingling in Rt elbow after completion of 3 min of UBE; resolved with rest.  He reported no increase in pain during session with exercises.  He reported reduction of pain after use of TENS and MHP at end of session.  Goals are ongoing.    Personal Factors and Comorbidities Age;Behavior Pattern;Comorbidity 2;Past/Current Experience;Profession;Time since onset of injury/illness/exacerbation    Comorbidities depression, anxiety    Examination-Participation Restrictions Church;Cleaning;Shop;Community Activity;Driving;Yard Work;Laundry;Meal Prep;Occupation    Stability/Clinical Decision Making Evolving/Moderate complexity    Rehab Potential Good    PT Frequency 1x / week    PT Duration 6 weeks    PT Treatment/Interventions ADLs/Self Care Home Management;Cryotherapy;Electrical Stimulation;Moist Heat;Traction;Balance training;Therapeutic exercise;Therapeutic activities;Ultrasound;Neuromuscular re-education;Patient/family education;Manual techniques;Vasopneumatic Device;Energy conservation;Dry needling;Passive range of motion     PT Next Visit Plan issue info on TENS, progress postural strengthening exercises; STM as indicated.    Consulted and Agree  with Plan of Care Patient           Patient will benefit from skilled therapeutic intervention in order to improve the following deficits and impairments:  Hypomobility,Decreased activity tolerance,Decreased strength,Increased fascial restricitons,Pain,Increased muscle spasms,Improper body mechanics,Decreased range of motion,Impaired flexibility,Postural dysfunction  Visit Diagnosis: Cervicalgia  Abnormal posture  Other muscle spasm     Problem List Patient Active Problem List   Diagnosis Date Noted  . Lateral epicondylitis of right elbow 05/10/2020  . Anxiety 01/15/2018   Mayer Camel, PTA 11/26/20 2:41 PM  [facility] [address]  [address] [place], [place], [address] Phone: [phone]   Fax:  [phone]  Name: Christopher Andersen MRN: [id number] Date of Birth: [birth year]

## 2020-12-02 ENCOUNTER — Ambulatory Visit: Payer: BC Managed Care – PPO | Admitting: Physician Assistant

## 2020-12-03 ENCOUNTER — Other Ambulatory Visit: Payer: Self-pay

## 2020-12-03 ENCOUNTER — Encounter: Payer: Self-pay | Admitting: Physical Therapy

## 2020-12-03 ENCOUNTER — Ambulatory Visit: Payer: BC Managed Care – PPO | Attending: Physician Assistant | Admitting: Physical Therapy

## 2020-12-03 DIAGNOSIS — M542 Cervicalgia: Secondary | ICD-10-CM | POA: Diagnosis not present

## 2020-12-03 DIAGNOSIS — R293 Abnormal posture: Secondary | ICD-10-CM | POA: Diagnosis not present

## 2020-12-03 DIAGNOSIS — M62838 Other muscle spasm: Secondary | ICD-10-CM | POA: Diagnosis not present

## 2020-12-03 NOTE — Therapy (Signed)
[facility] [address]  [place], [place], [address] Phone: [phone]   Fax:  [phone]  Physical Therapy Treatment  Patient Details  Name: Christopher Andersen MRN: [id number] Date of Birth: February 12, 1971 Referring Provider (PT): Erskine Emery, PA-C   Encounter Date: 12/03/2020   PT End of Session - 12/03/20 1152    Visit Number 3    Number of Visits 7    Date for PT Re-Evaluation 12/29/20    Authorization Type BCBS    PT Start Time 1023    PT Stop Time 1116    PT Time Calculation (min) 53 min    Activity Tolerance Patient tolerated treatment well;Patient limited by pain    Behavior During Therapy [facility] for tasks assessed/performed           Past Medical History:  Diagnosis Date  . Anxiety   . Depression     Past Surgical History:  Procedure Laterality Date  . TONSILLECTOMY      There were no vitals filed for this visit.   Subjective Assessment - 12/03/20 1025    Subjective Feels like the TENS helped the pain in his midback, now mostly having pain in the cervical spine. Has tried to be "softer" with his HEP to avoid overdoing it. Noticing some tingling with one of his exercises.    Pertinent History depression, anxiety    Diagnostic tests none recent    Patient Stated Goals decrease pain    Currently in Pain? Yes    Pain Score 2     Pain Location Neck    Pain Orientation Right;Left;Posterior    Pain Descriptors / Indicators Dull    Pain Type Chronic pain                             [facility] Adult PT Treatment/Exercise - 12/03/20 0001      Exercises   Exercises Neck      Neck Exercises: Standing   Other Standing Exercises thoracic extension at wall 5x   cues for forn     Neck Exercises: Seated   Neck Retraction 10 reps;3 secs    Neck Retraction Limitations pt reporting N/T down the R arm to elbow- discontonued      Neck Exercises: Supine   Neck Retraction 10 reps;3 secs     Neck Retraction Limitations towel roll under head   still with c/o N/T     Shoulder Exercises: ROM/Strengthening   UBE (Upper Arm Bike) L1 x 3 min forward/3 min back      Electrical Stimulation   Electrical Stimulation Location R thoracic paraspinals and thomboids    Electrical Stimulation Action IFC    Electrical Stimulation Parameters 80-150hz ; output to tolerance; 15 min    Electrical Stimulation Goals Pain      Manual Therapy   Manual Therapy Joint mobilization;Manual Traction    Manual therapy comments prone    Joint Mobilization central PAs over thoracic spine grade III to tolerance; assessed cervical spine central PAs but no hypomobility present   most hypomobility at T4 and T6 with c/o N/T over R arm with pressure which disspated after removing pressure   Manual Traction with pillow case assist 3x30" to tolerance   1 episode of R UE N/T to elbow - possibly positional                 PT Education - 12/03/20 1151  Education Details update to HEP; advised to discontinue cervical retraction    Person(s) Educated Patient    Methods Explanation;Demonstration;Tactile cues;Verbal cues;Handout    Comprehension Verbalized understanding;Returned demonstration            PT Short Term Goals - 12/03/20 1153      PT SHORT TERM GOAL #1   Title Patient to be independent with initial HEP.    Time 2    Period Weeks    Status On-going    Target Date 12/01/20             PT Long Term Goals - 12/03/20 1153      PT LONG TERM GOAL #1   Title Patient to be independent with advanced HEP.    Time 6    Period Weeks    Status On-going      PT LONG TERM GOAL #2   Title Patient to demonstrate B UE strength >/=4+/5.    Time 6    Period Weeks    Status On-going      PT LONG TERM GOAL #3   Title Patient to demonstrate cervical AROM WFL and without pain limiting.    Time 6    Period Weeks    Status On-going      PT LONG TERM GOAL #4   Title Patient to demonstrate and  recall correction of posture at rest and with activity.    Time 6    Period Weeks    Status On-going      PT LONG TERM GOAL #5   Title Patient to report 70% improvement in pain.    Time 6    Period Weeks    Status On-going                 Plan - 12/03/20 1153    Clinical Impression Statement Patient reporting improvement in midback pain with application of TENS last session. Noted compliance with HEP but reporting tingling with cervical retraction. Reviewed this exercise, with patient again reporting radicular symptoms down the R arm to the elbow. Attempted to modify this exercise with supine positioning, with no better tolerance. Thus advised patient to discontinue this exercise at home. Assessed cervical and thoracic joint mobility, with patient demonstrating hypomobility in the thoracic spine, worse at T4 and T6, with reproduction of N/T in the R arm with pressure. These symptoms dissipated after removing pressure. Patient tolerated gentle manual cervical traction well. Did report N/T in the R UE during last rep of traction, however this is likely positional d/t patient reporting supine positioning as being aggravating. Able to tolerate thoracic mobility ther-ex without complaints., thus updated this into HEP. Ended session with e-stim to R thoracic spine for pain relief. Patient without complaints at end of session.    Comorbidities depression, anxiety    PT Treatment/Interventions ADLs/Self Care Home Management;Cryotherapy;Electrical Stimulation;Moist Heat;Traction;Balance training;Therapeutic exercise;Therapeutic activities;Ultrasound;Neuromuscular re-education;Patient/family education;Manual techniques;Vasopneumatic Device;Energy conservation;Dry needling;Passive range of motion    PT Next Visit Plan issue info on TENS, progress postural strengthening exercises; STM as indicated.    Consulted and Agree with Plan of Care Patient           Patient will benefit from skilled  therapeutic intervention in order to improve the following deficits and impairments:  Hypomobility,Decreased activity tolerance,Decreased strength,Increased fascial restricitons,Pain,Increased muscle spasms,Improper body mechanics,Decreased range of motion,Impaired flexibility,Postural dysfunction  Visit Diagnosis: Cervicalgia  Abnormal posture  Other muscle spasm     Problem List Patient Active Problem List  Diagnosis Date Noted  . Lateral epicondylitis of right elbow 05/10/2020  . Anxiety 01/15/2018     Janene Harvey, PT, DPT 12/03/20 11:55 AM   [facility] [address]  [place], [place], [address] Phone: [phone]   Fax:  [phone]  Name: Christopher Andersen MRN: [id number] Date of Birth: [birth year]

## 2020-12-10 ENCOUNTER — Encounter: Payer: Self-pay | Admitting: Physical Therapy

## 2020-12-10 ENCOUNTER — Other Ambulatory Visit: Payer: Self-pay

## 2020-12-10 ENCOUNTER — Ambulatory Visit: Payer: BC Managed Care – PPO | Admitting: Physical Therapy

## 2020-12-10 DIAGNOSIS — R293 Abnormal posture: Secondary | ICD-10-CM | POA: Diagnosis not present

## 2020-12-10 DIAGNOSIS — M542 Cervicalgia: Secondary | ICD-10-CM | POA: Diagnosis not present

## 2020-12-10 DIAGNOSIS — M62838 Other muscle spasm: Secondary | ICD-10-CM | POA: Diagnosis not present

## 2020-12-10 NOTE — Therapy (Signed)
[facility] [address]  [place], [place], [address] Phone: [phone]   Fax:  [phone]  Physical Therapy Treatment  Patient Details  Name: Christopher Andersen MRN: [id number] Date of Birth: 1971/07/18 Referring Provider (PT): Erskine Emery, PA-C   Encounter Date: 12/10/2020   PT End of Session - 12/10/20 1028    Visit Number 4    Number of Visits 7    Date for PT Re-Evaluation 12/29/20    Authorization Type BCBS    PT Start Time 1020    PT Stop Time 1108    PT Time Calculation (min) 48 min    Activity Tolerance Patient tolerated treatment well;Patient limited by pain    Behavior During Therapy [facility] for tasks assessed/performed           Past Medical History:  Diagnosis Date  . Anxiety   . Depression     Past Surgical History:  Procedure Laterality Date  . TONSILLECTOMY      There were no vitals filed for this visit.   Subjective Assessment - 12/10/20 1022    Subjective "I'm having new stressors (at home)".   Pt reports he is mostly performing HEP when he starts to feel pain/discomfort. Low back is not longer bothering him. He has TENS unit, but hasn't used it yet.    Currently in Pain? Yes    Pain Score 2     Pain Location Neck    Pain Orientation Right;Left;Lower    Pain Descriptors / Indicators Dull    Aggravating Factors  night time, bad posture    Pain Relieving Factors changing positions of neck or RUE.              [facility] PT Assessment - 12/10/20 0001      Assessment   Medical Diagnosis Neck pain, Midback pain    Referring Provider (PT) Erskine Emery, PA-C    Onset Date/Surgical Date 11/18/19    Hand Dominance Right    Next MD Visit 12/27/20    Prior Therapy no            [facility] Adult PT Treatment/Exercise - 12/10/20 0001      Neck Exercises: Sidelying   Other Sidelying Exercise open book with thoracic rotation (slowly) x 5 reps each side.      Neck Exercises: Prone   W  Back 8 reps   with /without axial ext   Shoulder Extension 10 reps with scap retraction. Cues for form.      Lumbar Exercises: Quadruped   Madcat/Old Horse 5 reps    Other Quadruped Lumbar Exercises childs pose x 15 sec, then with buttocks up, elbows flexed for thoracic ext x 15 sec x 2      Shoulder Exercises: Seated   Other Seated Exercises W's x 5 sec x 5 reps      Shoulder Exercises: ROM/Strengthening   Nustep L5: arms @ 11, 5 min for warm up.      Shoulder Exercises: Stretch   Other Shoulder Stretches high level doorway stretch x 20 sec each x 2 reps each side. Cues for form.    Other Shoulder Stretches standing thoracic ext (per HEP) x 20 sec x 2 reps      Moist Heat Therapy   Number Minutes Moist Heat 10 Minutes    Moist Heat Location --   thoracic; during estim     Electrical Stimulation   Electrical Stimulation Location R thoracic paraspinals  and rhomboids    Electrical Stimulation Parameters intensity to tolerance    Electrical Stimulation Goals Pain                  PT Education - 12/10/20 1218    Education Details rolled towel in pillow for increased support of neutral spine.    Person(s) Educated Patient    Methods Explanation;Demonstration    Comprehension Verbalized understanding            PT Short Term Goals - 12/03/20 1153      PT SHORT TERM GOAL #1   Title Patient to be independent with initial HEP.    Time 2    Period Weeks    Status On-going    Target Date 12/01/20             PT Long Term Goals - 12/03/20 1153      PT LONG TERM GOAL #1   Title Patient to be independent with advanced HEP.    Time 6    Period Weeks    Status On-going      PT LONG TERM GOAL #2   Title Patient to demonstrate B UE strength >/=4+/5.    Time 6    Period Weeks    Status On-going      PT LONG TERM GOAL #3   Title Patient to demonstrate cervical AROM WFL and without pain limiting.    Time 6    Period Weeks    Status On-going      PT LONG TERM  GOAL #4   Title Patient to demonstrate and recall correction of posture at rest and with activity.    Time 6    Period Weeks    Status On-going      PT LONG TERM GOAL #5   Title Patient to report 70% improvement in pain.    Time 6    Period Weeks    Status On-going                 Plan - 12/10/20 1216    Clinical Impression Statement Pt reporting overall improvement of pain, with less intensity and frequency.  He had some Rt midback cramping with prone exercises; resolved with rest and quadruped stretches.  Pt reported the tightness in upper thoracic was less after performance of exercise and further reduction of pain with use of estim/MHP at end of session.  Progressing well towards goals.    Comorbidities depression, anxiety    PT Treatment/Interventions ADLs/Self Care Home Management;Cryotherapy;Electrical Stimulation;Moist Heat;Traction;Balance training;Therapeutic exercise;Therapeutic activities;Ultrasound;Neuromuscular re-education;Patient/family education;Manual techniques;Vasopneumatic Device;Energy conservation;Dry needling;Passive range of motion    PT Next Visit Plan assess goals/ ROM.  continue postural strengthening exercises. manual therapy as indicated.    Consulted and Agree with Plan of Care Patient           Patient will benefit from skilled therapeutic intervention in order to improve the following deficits and impairments:  Hypomobility,Decreased activity tolerance,Decreased strength,Increased fascial restricitons,Pain,Increased muscle spasms,Improper body mechanics,Decreased range of motion,Impaired flexibility,Postural dysfunction  Visit Diagnosis: Cervicalgia  Abnormal posture  Other muscle spasm     Problem List Patient Active Problem List   Diagnosis Date Noted  . Lateral epicondylitis of right elbow 05/10/2020  . Anxiety 01/15/2018   Kerin Perna, PTA 12/10/20 12:20 PM  [facility] [address]  [place], [place], [address] Phone: [phone]   Fax:  [phone]  Name: Christopher Andersen MRN:  [id number] Date of Birth: 1971/09/23

## 2020-12-17 ENCOUNTER — Encounter: Payer: Self-pay | Admitting: Physical Therapy

## 2020-12-17 ENCOUNTER — Other Ambulatory Visit: Payer: Self-pay

## 2020-12-17 ENCOUNTER — Ambulatory Visit: Payer: BC Managed Care – PPO | Admitting: Physical Therapy

## 2020-12-17 DIAGNOSIS — M62838 Other muscle spasm: Secondary | ICD-10-CM | POA: Diagnosis not present

## 2020-12-17 DIAGNOSIS — M542 Cervicalgia: Secondary | ICD-10-CM

## 2020-12-17 DIAGNOSIS — R293 Abnormal posture: Secondary | ICD-10-CM

## 2020-12-17 NOTE — Therapy (Signed)
[facility] [address]  [place], [place], [address] Phone: [phone]   Fax:  [phone]  Physical Therapy Treatment  Patient Details  Name: Christopher Andersen MRN: [id number] Date of Birth: 11/16/1970 Referring Provider (PT): Erskine Emery, PA-C   Encounter Date: 12/17/2020   PT End of Session - 12/17/20 1108    Visit Number 5    Number of Visits 7    Date for PT Re-Evaluation 12/29/20    Authorization Type BCBS    PT Start Time 1020   pt late   PT Stop Time 1113    PT Time Calculation (min) 53 min    Activity Tolerance Patient tolerated treatment well    Behavior During Therapy [facility] for tasks assessed/performed           Past Medical History:  Diagnosis Date  . Anxiety   . Depression     Past Surgical History:  Procedure Laterality Date  . TONSILLECTOMY      There were no vitals filed for this visit.   Subjective Assessment - 12/17/20 1021    Subjective Feeling "no worse" since last session. Back pain is improved, but still having neck pain with movements. Trying to avoid looking down so often. Has got his own TENS unit but has not tried it yet.    Pertinent History depression, anxiety    Diagnostic tests none recent    Patient Stated Goals decrease pain    Currently in Pain? No/denies              [facility] PT Assessment - 12/17/20 0001      Palpation   Palpation comment no TTP over B forearms  but with increased soft tissue restriction B, particularly in flexors                         [facility] Adult PT Treatment/Exercise - 12/17/20 0001      Neck Exercises: Machines for Strengthening   UBE (Upper Arm Bike) 2.0 x 3 min forward/3 min back    Cybex Row narrow grip 15x 25#; 10x 35#   cues to avoid hyperextension   Lat Pull 2x10 25#   cues to downwardly rotate scapulae; c/o R biceps N/T but no pain- resolved with head in neutral rather than extended     Neck Exercises: Standing    Other Standing Exercises B Y lift offs from wall 10x; 20x 2#    Other Standing Exercises thoracic extension at wall 10x3"   cues to avoid lumbar extension; review of exercise required d/t poor carryover     Shoulder Exercises: Seated   Horizontal ABduction Strengthening;Both;10 reps;Theraband    Theraband Level (Shoulder Horizontal ABduction) Level 2 (Red)    Horizontal ABduction Limitations good form    External Rotation Both;10 reps;Strengthening    Theraband Level (Shoulder External Rotation) Level 2 (Red)      Hand Exercises for Cervical Radiculopathy   Other Hand Exercise for Cervical Radiculopathy R/L B wrist flexion/extension stretch at wall 30" each      Electrical Stimulation   Electrical Stimulation Location R UT    Electrical Stimulation Action IFC    Electrical Stimulation Parameters 80-150hz ; output to tolerance; 15 min    Electrical Stimulation Goals Pain      Neck Exercises: Land 2 reps;30 seconds   cues for neutral c-spine and adjustment of arms  PT Short Term Goals - 12/17/20 1108      PT SHORT TERM GOAL #1   Title Patient to be independent with initial HEP.    Time 2    Period Weeks    Status Achieved    Target Date 12/01/20             PT Long Term Goals - 12/17/20 1109      PT LONG TERM GOAL #1   Title Patient to be independent with advanced HEP.    Time 6    Period Weeks    Status On-going      PT LONG TERM GOAL #2   Title Patient to demonstrate B UE strength >/=4+/5.    Time 6    Period Weeks    Status On-going      PT LONG TERM GOAL #3   Title Patient to demonstrate cervical AROM WFL and without pain limiting.    Time 6    Period Weeks    Status On-going      PT LONG TERM GOAL #4   Title Patient to demonstrate and recall correction of posture at rest and with activity.    Time 6    Period Weeks    Status On-going   shows and reports improved awareness of cervical spine positioning      PT LONG TERM GOAL #5   Title Patient to report 70% improvement in pain.    Time 6    Period Weeks    Status On-going                 Plan - 12/17/20 1108    Clinical Impression Statement Patient reporting no new symptoms today. Still notes neck pain, but back pain is improved. Noted tightness in R elbow, thus palpated forearm musculature which was diffusely tight, and followed with wrist stretching. Performed machine strengthening with minor cueing to depress scapulae. Noted c/o R biceps N/T with lat pulldown which resolved with adjustment of cervical spine to neutral rather than extended. Periscapular strengthening was progressed with patient noting trouble coordinating proper muscle groups, but with good effort and focus throughout. Ended session with moist heat and e-stim to R UT per patient's request. Patient without complaints at end of session. Patient is demonstrating improved ther-ex tolerance with subsequent sessions.    Comorbidities depression, anxiety    PT Treatment/Interventions ADLs/Self Care Home Management;Cryotherapy;Electrical Stimulation;Moist Heat;Traction;Balance training;Therapeutic exercise;Therapeutic activities;Ultrasound;Neuromuscular re-education;Patient/family education;Manual techniques;Vasopneumatic Device;Energy conservation;Dry needling;Passive range of motion    PT Next Visit Plan assess goals/ ROM.  continue postural strengthening exercises. manual therapy as indicated.    Consulted and Agree with Plan of Care Patient           Patient will benefit from skilled therapeutic intervention in order to improve the following deficits and impairments:  Hypomobility,Decreased activity tolerance,Decreased strength,Increased fascial restricitons,Pain,Increased muscle spasms,Improper body mechanics,Decreased range of motion,Impaired flexibility,Postural dysfunction  Visit Diagnosis: Cervicalgia  Abnormal posture  Other muscle spasm     Problem  List Patient Active Problem List   Diagnosis Date Noted  . Lateral epicondylitis of right elbow 05/10/2020  . Anxiety 01/15/2018     Janene Harvey, PT, DPT 12/17/20 11:55 AM   [facility] [address]  [place], [place], [address] Phone: [phone]   Fax:  [phone]  Name: Christopher Andersen MRN: [id number] Date of Birth: 29-Oct-1971

## 2020-12-24 ENCOUNTER — Ambulatory Visit: Payer: BC Managed Care – PPO | Admitting: Physical Therapy

## 2020-12-24 ENCOUNTER — Encounter: Payer: Self-pay | Admitting: Physical Therapy

## 2020-12-24 ENCOUNTER — Other Ambulatory Visit: Payer: Self-pay

## 2020-12-24 DIAGNOSIS — M62838 Other muscle spasm: Secondary | ICD-10-CM

## 2020-12-24 DIAGNOSIS — R293 Abnormal posture: Secondary | ICD-10-CM | POA: Diagnosis not present

## 2020-12-24 DIAGNOSIS — M542 Cervicalgia: Secondary | ICD-10-CM | POA: Diagnosis not present

## 2020-12-24 NOTE — Therapy (Signed)
[facility] [address]  [place], [place], [address] Phone: [phone]   Fax:  [phone]  Physical Therapy Treatment  Patient Details  Name: Christopher Andersen MRN: [id number] Date of Birth: [birth year] Referring Provider (PT): Erskine Emery, PA-C   Encounter Date: 12/24/2020   PT End of Session - 12/24/20 1053    Visit Number 6    Number of Visits 7    Date for PT Re-Evaluation 12/29/20    Authorization Type BCBS    PT Start Time 1022    PT Stop Time 1050    PT Time Calculation (min) 28 min    Activity Tolerance Patient tolerated treatment well    Behavior During Therapy [facility] for tasks assessed/performed           Past Medical History:  Diagnosis Date  . Anxiety   . Depression     Past Surgical History:  Procedure Laterality Date  . TONSILLECTOMY      There were no vitals filed for this visit.   Subjective Assessment - 12/24/20 1023    Subjective Been a "pretty decent week. My arm got tight a couple times but was able to do my stretches." Reports 50% improvement in back pain, but no improvement in his neck- "I feel more restricted than ever."    Pertinent History depression, anxiety    Diagnostic tests none recent    Patient Stated Goals decrease pain    Currently in Pain? No/denies              [facility] PT Assessment - 12/24/20 0001      Assessment   Medical Diagnosis Neck pain, Midback pain    Referring Provider (PT) Erskine Emery, PA-C    Onset Date/Surgical Date 11/18/19      AROM   Cervical Flexion 53   pain at mid range which resolved   Cervical Extension 55   tingling   Cervical - Right Side Bend 45    Cervical - Left Side Bend 34    Cervical - Right Rotation 59    Cervical - Left Rotation 65   pain     Strength   Overall Strength Comments c/o tightness in the back with shoulder MMT    Right Shoulder Flexion 4+/5    Right Shoulder ABduction 4+/5    Right Shoulder Internal  Rotation 4+/5    Right Shoulder External Rotation 4+/5    Left Shoulder Flexion 4+/5    Left Shoulder ABduction 4+/5    Left Shoulder Internal Rotation 4+/5    Left Shoulder External Rotation 4+/5    Right Elbow Flexion 4+/5    Right Elbow Extension 4+/5    Left Elbow Flexion 4+/5    Left Elbow Extension 4+/5    Right Wrist Flexion 4+/5    Right Wrist Extension 4+/5    Left Wrist Flexion 4+/5    Left Wrist Extension 4+/5    Right Hand Grip (lbs) 80.3   80, 81, 80   Left Hand Grip (lbs) 62   61, 60, 65                        [facility] Adult PT Treatment/Exercise - 12/24/20 0001      Neck Exercises: Machines for Strengthening   UBE (Upper Arm Bike) 2.0 x 3 min forward/3 min back  PT Education - 12/24/20 1051    Education Details discussion on objective and subjective progress; answered patient's questions on next steps and possible treatment options    Person(s) Educated Patient    Methods Explanation    Comprehension Verbalized understanding            PT Short Term Goals - 12/24/20 1030      PT SHORT TERM GOAL #1   Title Patient to be independent with initial HEP.    Time 2    Period Weeks    Status Achieved    Target Date 12/01/20             PT Long Term Goals - 12/24/20 1030      PT LONG TERM GOAL #1   Title Patient to be independent with advanced HEP.    Time 6    Period Weeks    Status Partially Met   performing them as often as he feels the pain, but not daily     PT LONG TERM GOAL #2   Title Patient to demonstrate B UE strength >/=4+/5.    Time 6    Period Weeks    Status Achieved      PT LONG TERM GOAL #3   Title Patient to demonstrate cervical AROM WFL and without pain limiting.    Time 6    Period Weeks    Status Partially Met   improved in flexion and R rotation     PT LONG TERM GOAL #4   Title Patient to demonstrate and recall correction of posture at rest and with activity.    Time 6    Period  Weeks    Status Achieved   reporting improvement in awareness in posture while at work, also demonstrating more upright posture     PT LONG TERM GOAL #5   Title Patient to report 70% improvement in pain.    Time 6    Period Weeks    Status Partially Met   reporting 50% improvement in midback pain, 0% in neck                Plan - 12/24/20 1053    Clinical Impression Statement Patient reporting 50% improvement in back pain, but no improvement in his neck thus far. Notes good benefit with HEP stretches. Cervical AROM appears to have improved in flexion and R rotation, however still with pain and stiffness remaining. B UE strength goal has now been met- patient demonstrated considerable improvement in grip strength today. Patient notes improvement in awareness of posture while at work and also demonstrates a more upright posture while in session today. Took time to answer patient's questions about next steps- agreed upon 30 day hold at this time to allow patient to f/u with MD before returning to therapy. Patient reports independence with HEP and without complaints at end of session.    Comorbidities depression, anxiety    PT Treatment/Interventions ADLs/Self Care Home Management;Cryotherapy;Electrical Stimulation;Moist Heat;Traction;Balance training;Therapeutic exercise;Therapeutic activities;Ultrasound;Neuromuscular re-education;Patient/family education;Manual techniques;Vasopneumatic Device;Energy conservation;Dry needling;Passive range of motion    PT Next Visit Plan 30 day hold at this time    Consulted and Agree with Plan of Care Patient           Patient will benefit from skilled therapeutic intervention in order to improve the following deficits and impairments:  Hypomobility,Decreased activity tolerance,Decreased strength,Increased fascial restricitons,Pain,Increased muscle spasms,Improper body mechanics,Decreased range of motion,Impaired flexibility,Postural dysfunction  Visit  Diagnosis: Cervicalgia  Abnormal posture  Other muscle spasm     Problem List Patient Active Problem List   Diagnosis Date Noted  . Lateral epicondylitis of right elbow 05/10/2020  . Anxiety 01/15/2018     Janene Harvey, PT, DPT 12/24/20 10:58 AM   [facility] [address]  [place], [place], [address] Phone: [phone]   Fax:  [phone]  Name: MACEO HERNAN MRN: [id number] Date of Birth: 07-16-1971

## 2020-12-27 ENCOUNTER — Encounter: Payer: Self-pay | Admitting: Orthopaedic Surgery

## 2020-12-27 ENCOUNTER — Ambulatory Visit (INDEPENDENT_AMBULATORY_CARE_PROVIDER_SITE_OTHER): Payer: BC Managed Care – PPO | Admitting: Orthopaedic Surgery

## 2020-12-27 ENCOUNTER — Other Ambulatory Visit: Payer: Self-pay

## 2020-12-27 DIAGNOSIS — M792 Neuralgia and neuritis, unspecified: Secondary | ICD-10-CM

## 2020-12-27 DIAGNOSIS — M542 Cervicalgia: Secondary | ICD-10-CM

## 2020-12-27 MED ORDER — METHYLPREDNISOLONE 4 MG PO TABS: ORAL_TABLET | ORAL | 0 refills | Status: DC

## 2020-12-27 NOTE — Progress Notes (Signed)
The patient comes in today after having physical therapy on his cervical spine and thoracic spine due to pain but also having radicular symptoms going down his right arm with numbness and tingling.  He is right-hand dominant.  He has tried activity modification, time, anti-inflammatories and now outpatient physical therapy.  He is still having the same symptoms.  His upper back pain is improved significantly but he still having neck pain does wake him up at night and certain exercises and motions are causing the numbness and tingling to go down his right side.  He has had no other acute changes in medical status.  He is otherwise a healthy 50 year old gentleman.  On examination he does have a positive Spurling sign to the right side.  There is pain also the left side of his neck.  He has good strength in his bilateral upper extremities but subjective numbness going down the right upper arm and just past the elbow.  He has good grip and pinch strength bilaterally.  At this point given failure conservative treatment including anti-inflammatories, activity modification, time, and formal physical therapy and given the fact that his radicular symptoms are still persisting down his right upper extremity, a MRI of the cervical spine is warranted to rule out nerve compression.  I will start him on a steroid taper in the interim.  We will see him back for follow-up after the MRI.  All questions and concerns were answered and addressed.  He agrees with this treatment plan.

## 2020-12-31 ENCOUNTER — Encounter: Payer: BC Managed Care – PPO | Admitting: Physical Therapy

## 2021-01-10 ENCOUNTER — Ambulatory Visit
Admission: RE | Admit: 2021-01-10 | Discharge: 2021-01-10 | Disposition: A | Discharge status: Home / Self Care | Payer: BC Managed Care – PPO | Source: Ambulatory Visit | Attending: Orthopaedic Surgery | Admitting: Orthopaedic Surgery

## 2021-01-10 ENCOUNTER — Other Ambulatory Visit: Payer: Self-pay

## 2021-01-10 DIAGNOSIS — M4802 Spinal stenosis, cervical region: Secondary | ICD-10-CM | POA: Diagnosis not present

## 2021-01-10 DIAGNOSIS — M542 Cervicalgia: Secondary | ICD-10-CM

## 2021-01-10 DIAGNOSIS — M792 Neuralgia and neuritis, unspecified: Secondary | ICD-10-CM

## 2021-01-24 ENCOUNTER — Ambulatory Visit (INDEPENDENT_AMBULATORY_CARE_PROVIDER_SITE_OTHER): Payer: BC Managed Care – PPO | Admitting: Orthopaedic Surgery

## 2021-01-24 ENCOUNTER — Encounter: Payer: Self-pay | Admitting: Orthopaedic Surgery

## 2021-01-24 ENCOUNTER — Other Ambulatory Visit: Payer: Self-pay

## 2021-01-24 DIAGNOSIS — M542 Cervicalgia: Secondary | ICD-10-CM

## 2021-01-24 DIAGNOSIS — M792 Neuralgia and neuritis, unspecified: Secondary | ICD-10-CM | POA: Diagnosis not present

## 2021-01-24 DIAGNOSIS — M549 Dorsalgia, unspecified: Secondary | ICD-10-CM

## 2021-01-24 MED ORDER — CYCLOBENZAPRINE HCL 10 MG PO TABS: 10.0000 mg | ORAL_TABLET | Freq: Every day | ORAL | 0 refills | Status: DC

## 2021-01-24 NOTE — Progress Notes (Signed)
The patient comes in today to go over MRI of the cervical spine.  He has been having significant radicular symptoms with numbness and tingling going down his right arm as well as right-sided neck pain.  He still continues to have the symptoms.  This is been somewhat acute on chronic.  I did go over the MRI of the cervical spine with him.  It does show quite prominent facet arthrosis at C for C5 to the right side and this is creating moderate neuroforaminal stenosis.  There is also moderate right neuroforaminal stenosis at C5-C6 and C6-C7.  This does correlate with his symptoms he is having of numbness and tingling going down his right arm with right arm pain as well as right-sided neck pain.  At this point I would like to send him to Dr. Kary Kos or Dr. Sherley Bounds of [facility] neurosurgery for further ration treatment of this cervical issue.  The patient agrees with this referral as well.

## 2021-02-01 DIAGNOSIS — M5412 Radiculopathy, cervical region: Secondary | ICD-10-CM | POA: Diagnosis not present

## 2021-02-08 ENCOUNTER — Encounter: Payer: Self-pay | Admitting: Physical Therapy

## 2021-02-08 ENCOUNTER — Other Ambulatory Visit: Payer: Self-pay

## 2021-02-08 ENCOUNTER — Ambulatory Visit: Payer: BC Managed Care – PPO | Attending: Neurosurgery | Admitting: Physical Therapy

## 2021-02-08 DIAGNOSIS — R293 Abnormal posture: Secondary | ICD-10-CM | POA: Diagnosis not present

## 2021-02-08 DIAGNOSIS — M542 Cervicalgia: Secondary | ICD-10-CM | POA: Insufficient documentation

## 2021-02-08 DIAGNOSIS — M62838 Other muscle spasm: Secondary | ICD-10-CM | POA: Insufficient documentation

## 2021-02-08 DIAGNOSIS — M5412 Radiculopathy, cervical region: Secondary | ICD-10-CM | POA: Insufficient documentation

## 2021-02-08 NOTE — Patient Instructions (Signed)
Trigger Point Dry Needling  . What is Trigger Point Dry Needling (DN)? o DN is a physical therapy technique used to treat muscle pain and dysfunction. Specifically, DN helps deactivate muscle trigger points (muscle knots).  o A thin filiform needle is used to penetrate the skin and stimulate the underlying trigger point. The goal is for a local twitch response (LTR) to occur and for the trigger point to relax. No medication of any kind is injected during the procedure.   . What Does Trigger Point Dry Needling Feel Like?  o The procedure feels different for each individual patient. Some patients report that they do not actually feel the needle enter the skin and overall the process is not painful. Very mild bleeding may occur. However, many patients feel a deep cramping in the muscle in which the needle was inserted. This is the local twitch response.   Marland Kitchen How Will I feel after the treatment? o Soreness is normal, and the onset of soreness may not occur for a few hours. Typically this soreness does not last longer than two days.  o Bruising is uncommon, however; ice can be used to decrease any possible bruising.  o In rare cases feeling tired or nauseous after the treatment is normal. In addition, your symptoms may get worse before they get better, this period will typically not last longer than 24 hours.   . What Can I do After My Treatment? o Increase your hydration by drinking more water for the next 24 hours. o You may place ice or heat on the areas treated that have become sore, however, do not use heat on inflamed or bruised areas. Heat often brings more relief post needling. o You can continue your regular activities, but vigorous activity is not recommended initially after the treatment for 24 hours. DN is best combined with other physical therapy such as strengthening, stretching, and other therapies.        Access Code: [email or web address] URL: [email or web address] Date:  02/08/2021 Prepared by: Annie Paras  Exercises Seated Gentle Upper Trapezius Stretch - 2-3 x daily - 7 x weekly - 1 sets - 2 reps - 30 sec hold Seated Levator Scapulae Stretch - 2-3 x daily - 7 x weekly - 1 sets - 2 reps - 30 hold Seated Scapular Retraction - 2 x daily - 7 x weekly - 2 sets - 10 reps - 3 sec hold  Patient Education Office Posture

## 2021-02-08 NOTE — Therapy (Signed)
[facility] [address]  [place], [place], [address] Phone: [phone]   Fax:  [phone]  Physical Therapy Evaluation  Patient Details  Name: Christopher Andersen MRN: [id number] Date of Birth: September 24, 1971 Referring Provider (PT): Julien Girt. Saintclair Halsted, MD   Encounter Date: 02/08/2021   PT End of Session - 02/08/21 0906    Visit Number 1    Number of Visits 4    Date for PT Re-Evaluation 03/08/21    Authorization Type BCBS    PT Start Time 0812    PT Stop Time 0858    PT Time Calculation (min) 46 min    Activity Tolerance Patient tolerated treatment well    Behavior During Therapy [facility] for tasks assessed/performed           Past Medical History:  Diagnosis Date  . Anxiety   . Depression     Past Surgical History:  Procedure Laterality Date  . TONSILLECTOMY      There were no vitals filed for this visit.    Subjective Assessment - 02/08/21 0812    Subjective [name] reports his surgeon recommended surgery to treat cervical radiculopathy but he doesn't want that. He was here for PT for back in January but now it's the neck that's the issue. He reports pain in his neck that radiates to his R elbow.    How long can you sit comfortably? Depends on if he is sitting with bad posture    How long can you stand comfortably? doesn' bother unless positional changes quick    How long can you walk comfortably? Same as standing    Diagnostic tests Cervical MRI 01/10/21: 1. Severe right facet arthrosis at C4-5 with prominent facet edema, trace anterolisthesis, and moderate right neural foraminal stenosis.  2. Moderate right neural foraminal stenosis at C5-6 and C6-7.  3. Mild-to-moderate spinal stenosis at C3-4.    Patient Stated Goals Looking for information, stretches, information etc to get rid of the pain and get back to functional activities    Currently in Pain? Yes    Pain Score 4     Pain Location Neck    Pain Orientation  Right    Pain Descriptors / Indicators Dull;Spasm;Sharp;Tingling;Radiating;Numbness    Pain Type Chronic pain   Has had issue since November; remembers it after going to [place] on a rollercoaster   Pain Radiating Towards Numbness radiating to Right Lateral epicondyle - R elbow    Pain Onset More than a month ago    Pain Frequency Constant   the level varies   Aggravating Factors  Sleep is hard; has to reposition a lot; turning head real quick; Pain is irritable    Pain Relieving Factors Stretching the neck and avoiding aggravating positions    Effect of Pain on Daily Activities Can't move like he used to; has to adjust position to avoid aggravating activities              [facility] PT Assessment - 02/08/21 0821      Assessment   Medical Diagnosis Cervical radiculopathy    Referring Provider (PT) Julien Girt. Saintclair Halsted, MD    Hand Dominance Right    Next MD Visit first week of May    Prior Therapy Mid-January for neck and midback pain      Precautions   Precautions None      Restrictions   Weight Bearing Restrictions No      Balance Screen  Has the patient fallen in the past 6 months No    Has the patient had a decrease in activity level because of a fear of falling?  No    Is the patient reluctant to leave their home because of a fear of falling?  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      Cognition   Overall Cognitive Status Within Functional Limits for tasks assessed      Observation/Other Assessments   Focus on Therapeutic Outcomes (FOTO)   Neck - 48; predicted 61      Posture/Postural Control   Posture Comments WFL   Tight UT and LScap     AROM   Right Shoulder Flexion --   WFL   Right Shoulder ABduction --   [facility]   Right Shoulder Internal Rotation --      Right Shoulder External Rotation --   WFL   Left Shoulder Flexion --   WFL   Left Shoulder ABduction --   [facility]   Left Shoulder Internal Rotation --   [facility]   Left Shoulder External Rotation --   [facility]   Cervical Flexion 45   felt a stretch   Cervical Extension 60   felt some tingling owards R elbow   Cervical - Right Side Bend 57   painful   Cervical - Left Side Bend 48   painful   Cervical - Right Rotation 72   Brought pain   Cervical - Left Rotation 74   pain and tingling     Spurling's   Findings Positive    Side Right      Distraction Test   Findngs Positive    side Right                      Objective measurements completed on examination: See above findings.               PT Education - 02/08/21 0905    Education Details Education provided for Correct Work Posture; Trigger Point dry needling; HEP    Person(s) Educated Patient    Methods Explanation;Demonstration;Tactile cues;Verbal cues;Handout    Comprehension Tactile cues required;Verbal cues required;Returned demonstration;Verbalized understanding            PT Short Term Goals - 02/08/21 0920      PT SHORT TERM GOAL #1   Title Patient to be independent with initial HEP.    Status New    Target Date 02/22/21             PT Long Term Goals - 02/08/21 0921      PT LONG TERM GOAL #1   Title Patient to be independent with advanced HEP.    Status New    Target Date 03/08/21      PT LONG TERM GOAL #2   Title Patient will report centralization of radicular symptoms.    Status New    Target Date 03/08/21  PT LONG TERM GOAL #3   Title Patient to demonstrate cervical AROM WFL and without pain limiting to improve function for driving, bird watching, etc.     Status New    Target Date 03/08/21      PT LONG TERM GOAL #4   Title Patient to report decrease in pain when it is at its worst > / = 50-75%    Status New    Target Date 03/08/21      PT LONG TERM GOAL #5   Title Pt. will score > / = 61 on FOTO    Status New    Target Date 03/08/21                  Plan - 02/08/21 0915    Clinical Impression Statement Salar was referred to outpatient PT for cervical radiculopathy at C6. He reports he wants to avoid surgery and hopes to get pain relief through PT for return to full function. He completed some PT in January-February 2022 for mid back pain here at [facility] for mid-back pain and cervical radiculopathy. He had success with PT for his midback pain. He presents with tightness in B UT and LS, has pain at end ranges for all cervical ROM and tingling with cervical extension and left rotation. R. Spurlings test was positive and cervical distraction was positive. His ROM is WFL. Pt. will benefit from skilled PT to centralize radicular symptoms and decrease pain for improved cervical function.    Personal Factors and Comorbidities Age;Behavior Pattern;Comorbidity 2;Profession;Time since onset of injury/illness/exacerbation    Comorbidities depression, anxiety    Examination-Activity Limitations Bed Mobility;Reach Overhead;Other    Examination-Participation Restrictions Cleaning;Driving;Yard Work;Other    Stability/Clinical Decision Making Stable/Uncomplicated    Clinical Decision Making Low    Rehab Potential Good    PT Frequency 1x / week    PT Duration 4 weeks    PT Treatment/Interventions ADLs/Self Care Home Management;Moist Heat;Traction;Therapeutic exercise;Therapeutic activities;Functional mobility training;Patient/family education;Manual techniques;Passive range of motion;Dry needling    PT Next Visit Plan DN/STM/DTM to B UT and LS; Stretching and strengthening Cervical paraspinals and scapular stabilizers; Cervical traction;  Assess PAIVM and repeated movements    PT Home Exercise Plan [email or web address]    Consulted and Agree with Plan of Care Patient           Patient will benefit from skilled therapeutic intervention in order to improve the following deficits and impairments:  Increased fascial restricitons,Pain,Increased muscle spasms,Decreased activity tolerance,Impaired sensation  Visit Diagnosis: Radiculopathy, cervical region  Cervicalgia  Other muscle spasm     Problem List Patient Active Problem List   Diagnosis Date Noted  . Lateral epicondylitis of right elbow 05/10/2020  . Anxiety 01/15/2018    Newman Nickels SPT 02/08/2021, 10:55 AM  [facility] [address]  [place], [place], [address] Phone: [phone]   Fax:  [phone]  Name: MOHAB ASHBY MRN: [id number] Date of Birth: 04-04-1971

## 2021-02-09 NOTE — Addendum Note (Signed)
Addended by: Percival Spanish on: 02/09/2021 01:22 PM   Modules accepted: Orders

## 2021-02-16 ENCOUNTER — Other Ambulatory Visit: Payer: Self-pay

## 2021-02-16 ENCOUNTER — Encounter: Payer: Self-pay | Admitting: Physical Therapy

## 2021-02-16 ENCOUNTER — Ambulatory Visit: Payer: BC Managed Care – PPO | Admitting: Physical Therapy

## 2021-02-16 DIAGNOSIS — R293 Abnormal posture: Secondary | ICD-10-CM | POA: Diagnosis not present

## 2021-02-16 DIAGNOSIS — M542 Cervicalgia: Secondary | ICD-10-CM

## 2021-02-16 DIAGNOSIS — M62838 Other muscle spasm: Secondary | ICD-10-CM | POA: Diagnosis not present

## 2021-02-16 DIAGNOSIS — M5412 Radiculopathy, cervical region: Secondary | ICD-10-CM

## 2021-02-16 NOTE — Therapy (Addendum)
[facility] [address]  [place], [place], [address] Phone: [phone]   Fax:  [phone]  Physical Therapy Treatment / Discharge Summary  Patient Details  Name: Christopher Andersen MRN: [id number] Date of Birth: 03/20/1971 Referring Provider (PT): Julien Girt. Saintclair Halsted, MD   Encounter Date: 02/16/2021   PT End of Session - 02/16/21 1024    Visit Number 2    Number of Visits 4    Date for PT Re-Evaluation 03/08/21    Authorization Type BCBS    PT Start Time 0932    PT Stop Time 1016    PT Time Calculation (min) 44 min    Activity Tolerance Patient tolerated treatment well    Behavior During Therapy [facility] for tasks assessed/performed           Past Medical History:  Diagnosis Date  . Anxiety   . Depression     Past Surgical History:  Procedure Laterality Date  . TONSILLECTOMY      There were no vitals filed for this visit.   Subjective Assessment - 02/16/21 0935    Subjective Pt reports he has more range of motion and can find more positions for comfortable sleeping posture. Was able to play 9 holes of golf on Friday with minimal shoulder soreness following. Feels less tingling in his R arm. Still has to find a correct head position for sleeping.    How long can you sit comfortably? Depends on if he is sitting with bad posture    How long can you stand comfortably? doesn' bother unless positional changes quick    How long can you walk comfortably? Same as standing    Diagnostic tests Cervical MRI 01/10/21: 1. Severe right facet arthrosis at C4-5 with prominent facet edema, trace anterolisthesis, and moderate right neural foraminal stenosis.  2. Moderate right neural foraminal stenosis at C5-6 and C6-7.  3. Mild-to-moderate spinal stenosis at C3-4.    Patient Stated Goals Looking for information, stretches, information etc to get rid of the pain and get back to functional activities    Currently in Pain? No/denies     Pain Score 0-No pain    Pain Onset More than a month ago              [facility] PT Assessment - 02/16/21 0001      Palpation   Spinal mobility WFL; nothing hypomobile with PAIVM assessment   L SB at C5-6 brought tingling with PPIVM                        [facility] Adult PT Treatment/Exercise - 02/16/21 0933      Neck Exercises: Machines for Strengthening   UBE (Upper Arm Bike) L3 x 3 minutes fwd/3backwd      Neck Exercises: Seated   Neck Retraction 20 reps;3 secs      Shoulder Exercises: Seated   Row Strengthening;Both;20 reps;Weights    Theraband Level (Shoulder Row) Level 2 (Red)   1 x 5 for HEP instruction   Row Weight (lbs) 1 x 10 #15; 1 x 10 # 15      Manual Therapy   Manual Therapy Soft tissue mobilization    Manual therapy comments STM/DTM B UT      Neck Exercises: Stretches   Other Neck Stretches Chin tuck with suboccipital stretch 2 x 30 seconds  PT Education - 02/16/21 1023    Education Details Education on updated HEP access code [email or web address]    Person(s) Educated Patient    Methods Explanation;Demonstration;Tactile cues;Verbal cues;Handout    Comprehension Tactile cues required;Verbal cues required;Returned demonstration;Verbalized understanding            PT Short Term Goals - 02/16/21 1034      PT SHORT TERM GOAL #1   Title Patient to be independent with initial HEP.    Status Achieved    Target Date 02/22/21   02/16/2021            PT Long Term Goals - 02/16/21 1035      PT LONG TERM GOAL #1   Title Patient to be independent with advanced HEP.    Status On-going    Target Date 03/08/21      PT LONG TERM GOAL #2   Title Patient will report centralization of radicular symptoms.    Status On-going    Target Date 03/08/21      PT LONG TERM GOAL #3   Title Patient to demonstrate cervical AROM WFL and without pain limiting to improve function for driving, bird watching, etc.    Status On-going    Target Date  03/08/21      PT LONG TERM GOAL #4   Title Patient to report decrease in pain when it is at its worst > / = 50-75%    Status On-going    Target Date 03/08/21      PT LONG TERM GOAL #5   Title Pt. will score > / = 61 on FOTO    Status On-going    Target Date 03/08/21                 Plan - 02/16/21 1032    Clinical Impression Statement Christopher Andersen reports his motion has improved in his neck and he has been able to sleep a little better even though it is tough to find a correct head position. He has been keeping up with the HEP and reports the stretches have helped a lot. He was able to play 9 holes of golf with minimal pain in his shoulders. He has achieved STG #1. He presents with mild tightness in B UT and has tingling in certain spinal positions. He reports tingling in his R arm to his elbow with L SB during PPIVM assessment. The tingling is also brought on with L and R SB and Flex/Ext with active movement. Active ROTN seems to relieve the tingling. He will continue to benefit from skilled PT to relieve muscle tightness and centralize the tingling and numbness in his R arm and ease pain in his neck for improved function.    Personal Factors and Comorbidities Age;Behavior Pattern;Comorbidity 2;Profession;Time since onset of injury/illness/exacerbation    Comorbidities depression, anxiety    Examination-Activity Limitations Bed Mobility;Reach Overhead;Other    Examination-Participation Restrictions Cleaning;Driving;Yard Work;Other    Stability/Clinical Decision Making Stable/Uncomplicated    Rehab Potential Good    PT Frequency 1x / week    PT Duration 4 weeks    PT Treatment/Interventions ADLs/Self Care Home Management;Moist Heat;Traction;Therapeutic exercise;Therapeutic activities;Functional mobility training;Patient/family education;Manual techniques;Passive range of motion;Dry needling    PT Next Visit Plan DN/STM/DTM to B UT as needed; Progress Cervical paraspinals stabilization  training; strengthen scapular stabilizers; Cervical traction; Assess repeated movements; update HEP    PT Home Exercise Plan [email or web address]; [email or web address] (4/20)    Consulted and Agree with Plan of Care Patient             Patient will benefit from skilled therapeutic intervention in order to improve the following deficits and impairments:  Increased fascial restricitons,Pain,Increased muscle spasms,Decreased activity tolerance,Impaired sensation  Visit Diagnosis: Radiculopathy, cervical region  Cervicalgia  Other muscle spasm  Abnormal posture     Problem List Patient Active Problem List   Diagnosis Date Noted  . Lateral epicondylitis of right elbow 05/10/2020  . Anxiety 01/15/2018    Newman Nickels SPT 02/16/2021, 12:20 PM  [facility] [address]  [place], [place], [address] Phone: [phone]   Fax:  [phone]  Name: Christopher Andersen MRN: [id number] Date of Birth: 20-Oct-1971  PHYSICAL THERAPY DISCHARGE SUMMARY  Visits from Start of Care: 2  Current functional level related to goals / functional outcomes:   Refer to above clinical impression for status as of last visit on 02/16/21. Patient cancelled his next scheduled appointment stating he was planning to f/u with his neurosurgeon and would be in touch. No further communication received and patient has not returned to PT in >30 days, therefore will proceed with discharge from PT for this episode.   Remaining deficits:   As above. Unable to formally assess status at discharge due to failure to return to PT.   Education / Equipment:   HEP  Plan: Patient agrees to discharge.  Patient goals were partially met. Patient is being discharged due to not returning since the last visit.  ?????     Percival Spanish, PT, MPT 04/06/21, 9:28 AM  [facility] [address]  [place], [place],  [address] Phone: [phone]   Fax:  [phone]

## 2021-02-16 NOTE — Patient Instructions (Signed)
    Access Code: [facility] URL: [email or web address] Date: 02/16/2021 Prepared by: Annie Paras  Exercises Sub-Occipital Cervical Stretch - 2 x daily - 7 x weekly - 3 reps - 30 sec hold Seated Cervical Retraction - 2 x daily - 7 x weekly - 2 sets - 10 reps - 3 sec hold Standing Bilateral Low Shoulder Row with Anchored Resistance - 1 x daily - 7 x weekly - 2 sets - 10 reps - 3 sec hold

## 2021-03-01 ENCOUNTER — Encounter: Payer: BC Managed Care – PPO | Admitting: Physical Therapy

## 2021-06-28 DIAGNOSIS — R030 Elevated blood-pressure reading, without diagnosis of hypertension: Secondary | ICD-10-CM | POA: Diagnosis not present

## 2021-06-28 DIAGNOSIS — Z6825 Body mass index (BMI) 25.0-25.9, adult: Secondary | ICD-10-CM | POA: Diagnosis not present

## 2021-06-28 DIAGNOSIS — M5412 Radiculopathy, cervical region: Secondary | ICD-10-CM | POA: Diagnosis not present

## 2021-06-30 ENCOUNTER — Encounter: Payer: BC Managed Care – PPO | Admitting: Physician Assistant

## 2021-07-14 DIAGNOSIS — M5412 Radiculopathy, cervical region: Secondary | ICD-10-CM | POA: Diagnosis not present

## 2021-07-26 ENCOUNTER — Ambulatory Visit (INDEPENDENT_AMBULATORY_CARE_PROVIDER_SITE_OTHER): Payer: BC Managed Care – PPO | Admitting: Medical

## 2021-07-26 ENCOUNTER — Encounter: Payer: Self-pay | Admitting: Medical

## 2021-07-26 ENCOUNTER — Other Ambulatory Visit: Payer: Self-pay

## 2021-07-26 VITALS — BP 155/89 | HR 71 | Temp 98.2°F | Resp 18 | Ht 71.0 in | Wt 196.0 lb

## 2021-07-26 DIAGNOSIS — R748 Abnormal levels of other serum enzymes: Secondary | ICD-10-CM

## 2021-07-26 DIAGNOSIS — R198 Other specified symptoms and signs involving the digestive system and abdomen: Secondary | ICD-10-CM

## 2021-07-26 DIAGNOSIS — K219 Gastro-esophageal reflux disease without esophagitis: Secondary | ICD-10-CM

## 2021-07-26 DIAGNOSIS — I10 Essential (primary) hypertension: Secondary | ICD-10-CM | POA: Diagnosis not present

## 2021-07-26 DIAGNOSIS — Z1283 Encounter for screening for malignant neoplasm of skin: Secondary | ICD-10-CM | POA: Diagnosis not present

## 2021-07-26 DIAGNOSIS — R739 Hyperglycemia, unspecified: Secondary | ICD-10-CM

## 2021-07-26 DIAGNOSIS — K635 Polyp of colon: Secondary | ICD-10-CM

## 2021-07-26 DIAGNOSIS — Z0000 Encounter for general adult medical examination without abnormal findings: Secondary | ICD-10-CM | POA: Diagnosis not present

## 2021-07-26 DIAGNOSIS — Z125 Encounter for screening for malignant neoplasm of prostate: Secondary | ICD-10-CM

## 2021-07-26 DIAGNOSIS — K648 Other hemorrhoids: Secondary | ICD-10-CM

## 2021-07-26 DIAGNOSIS — L989 Disorder of the skin and subcutaneous tissue, unspecified: Secondary | ICD-10-CM

## 2021-07-26 LAB — CBC WITH DIFFERENTIAL/PLATELET
Basophils Absolute: 0.1 10*3/uL (ref 0.0–0.1)
Basophils Relative: 0.7 % (ref 0.0–3.0)
Eosinophils Absolute: 0.1 10*3/uL (ref 0.0–0.7)
Eosinophils Relative: 1 % (ref 0.0–5.0)
HCT: 43.8 % (ref 39.0–52.0)
Hemoglobin: 14.8 g/dL (ref 13.0–17.0)
Lymphocytes Relative: 27.2 % (ref 12.0–46.0)
Lymphs Abs: 2.3 10*3/uL (ref 0.7–4.0)
MCHC: 33.8 g/dL (ref 30.0–36.0)
MCV: 94.3 fl (ref 78.0–100.0)
Monocytes Absolute: 0.9 10*3/uL (ref 0.1–1.0)
Monocytes Relative: 10.9 % (ref 3.0–12.0)
Neutro Abs: 5.1 10*3/uL (ref 1.4–7.7)
Neutrophils Relative %: 60.2 % (ref 43.0–77.0)
Platelets: 229 10*3/uL (ref 150.0–400.0)
RBC: 4.64 Mil/uL (ref 4.22–5.81)
RDW: 13.4 % (ref 11.5–15.5)
WBC: 8.4 10*3/uL (ref 4.0–10.5)

## 2021-07-26 LAB — COMPREHENSIVE METABOLIC PANEL
ALT: 62 U/L — ABNORMAL HIGH (ref 0–53)
AST: 36 U/L (ref 0–37)
Albumin: 4.7 g/dL (ref 3.5–5.2)
Alkaline Phosphatase: 35 U/L — ABNORMAL LOW (ref 39–117)
BUN: 24 mg/dL — ABNORMAL HIGH (ref 6–23)
CO2: 27 mEq/L (ref 19–32)
Calcium: 10.1 mg/dL (ref 8.4–10.5)
Chloride: 100 mEq/L (ref 96–112)
Creatinine, Ser: 1.07 mg/dL (ref 0.40–1.50)
GFR: 81.44 mL/min (ref >60.00)
Glucose, Bld: 70 mg/dL (ref 70–99)
Potassium: 4.2 mEq/L (ref 3.5–5.1)
Sodium: 138 mEq/L (ref 135–145)
Total Bilirubin: 0.6 mg/dL (ref 0.2–1.2)
Total Protein: 7.7 g/dL (ref 6.0–8.3)

## 2021-07-26 LAB — LIPID PANEL
Cholesterol: 280 mg/dL — ABNORMAL HIGH (ref 0–200)
HDL: 72 mg/dL (ref >39.00)
NonHDL: 207.98
Total CHOL/HDL Ratio: 4
Triglycerides: 352 mg/dL — ABNORMAL HIGH (ref 0.0–149.0)
VLDL: 70.4 mg/dL — ABNORMAL HIGH (ref 0.0–40.0)

## 2021-07-26 LAB — HEMOGLOBIN A1C: Hgb A1c MFr Bld: 5.4 % (ref 4.6–6.5)

## 2021-07-26 LAB — PSA: PSA: 1.18 ng/mL (ref 0.10–4.00)

## 2021-07-26 LAB — LDL CHOLESTEROL, DIRECT: Direct LDL: 131 mg/dL

## 2021-07-26 MED ORDER — LOSARTAN POTASSIUM 50 MG PO TABS: 50.0000 mg | ORAL_TABLET | Freq: Every day | ORAL | 3 refills | Status: DC

## 2021-07-26 NOTE — Progress Notes (Signed)
Subjective:    Patient ID: Christopher Andersen, male    DOB: 1970/11/05, 50 y.o.   MRN: [id number]  HPI  Pt in for wellness /cpe. More than 3 years since I saw.  Pt is fasting.  Pt in for first time. Pt works [employer]. Supervisor at call center. Does not exercise regularly. Pt states eats moderate healthy/admits to much fried foods, 2-3 cups of coffee a day, alcohol use about 3-5 beers a day. Divorced. 8 children. Youngest 34 yo. Oldest 42 years old.  Pt declined flu vaccine.  Pt had 2 covid vaccines. Declines 3rd vaccine.  Hx of mild lft elevation and cholesterol elevation.  Pt update me that he ha been seeing neurosurgeon and orthpedist for neck and shoulder pain.  Pt states his bp is always high on visit with different providers/dentist etc.  Pt also mentions reflux symptoms that persist. Has had for years. Uses famotadine.    Review of Systems  Constitutional:  Negative for chills, fatigue and fever.  Respiratory:  Negative for cough, chest tightness, shortness of breath and wheezing.   Cardiovascular:  Negative for chest pain and palpitations.  Gastrointestinal:  Negative for abdominal distention, abdominal pain, blood in stool, constipation and nausea.  Genitourinary:  Negative for dysuria and flank pain.  Musculoskeletal:  Negative for back pain.  Skin:  Negative for pallor and rash.    Past Medical History:  Diagnosis Date   Anxiety    Depression      Social History   Socioeconomic History   Marital status: Divorced    Spouse name: Not on file   Number of children: 8   Years of education: Not on file   Highest education level: Not on file  Occupational History   Occupation: [employer]  Tobacco Use   Smoking status: Never   Smokeless tobacco: Never  Vaping Use   Vaping Use: Never used  Substance and Sexual Activity   Alcohol use: Yes    Comment: 3-5 beers daily.   Drug use: No   Sexual activity: Yes  Other Topics Concern   Not on file   Social History Narrative   Not on file   Social Determinants of Health   Financial Resource Strain: Not on file  Food Insecurity: Not on file  Transportation Needs: Not on file  Physical Activity: Not on file  Stress: Not on file  Social Connections: Not on file  Intimate Partner Violence: Not on file    Past Surgical History:  Procedure Laterality Date   TONSILLECTOMY      No family history on file.  No Known Allergies  No current outpatient medications on file prior to visit.   Current Facility-Administered Medications on File Prior to Visit  Medication Dose Route Frequency Provider Last Rate Last Admin   0.9 %  sodium chloride infusion  500 mL Intravenous Once Danis, Estill Cotta III, MD        BP (!) 160/90   Pulse 71   Temp 98.2 F (36.8 C)   Resp 18   Ht 5\' 11"  (1.803 m)   Wt 196 lb (88.9 kg)   SpO2 100%   BMI 27.34 kg/m       Objective:   Physical Exam  General Mental Status- Alert. General Appearance- Not in acute distress.   Skin General: Color- Normal Color. Moisture- Normal Moisture.  Neck Carotid Arteries- Normal color. Moisture- Normal Moisture. No carotid bruits. No JVD.  Chest and Lung Exam Auscultation: Breath  Sounds:-Normal.  Cardiovascular Auscultation:Rythm- Regular. Murmurs & Other Heart Sounds:Auscultation of the heart reveals- No Murmurs.  Abdomen Inspection:-Inspeection Normal. Palpation/Percussion:Note:No mass. Palpation and Percussion of the abdomen reveal- Non Tender, Non Distended + BS, no rebound or guarding.   Neurologic Cranial Nerve exam:- CN III-XII intact(No nystagmus), symmetric smile. Strength:- 5/5 equal and symmetric strength both upper and lower extremities.   Rectal - on external inspection did not see external hemorrhoid or rectal mucosal tear.     Assessment & Plan:   Patient Instructions  For you wellness exam today I have ordered cbc, cmp and  lipid panel. Screening ps.  For elevated blood sugar get  a1c.  Vaccine declined.  Recommend exercise and healthy diet.  We will let you know lab results as they come in.  Follow up date appointment will be determined after lab review.  But also would asked that you make appointment in 2 weeks so we can recheck your blood pressure on medication.  For GERD symptoms recommend bland healthy diet.  Avoid caffeinated beverages, fried foods, greasy foods and reducing alcohol should help as well.  For small skin lesions on exam and scattered small moles went ahead and placed referral for dermatology evaluation.  For history of colon polyp, recent painful bowel movements and history of hemorrhoids with bright intermittent blood went ahead and placed referral to GI MD.  Asking your gastroenterologist to evaluate.  Not sure when you are due for repeat colonoscopy.  History of hypertension.  Advised on low-salt diet and prescribed losartan 50 mg daily.  Rx advisement given.  History of elevated liver enzymes.  With today's labs if enzymes are again elevated would recommend a repeat enzymes after 7 to 10 days of alcohol abstinence.    Mackie Pai, PA-C

## 2021-07-26 NOTE — Patient Instructions (Addendum)
For you wellness exam today I have ordered cbc, cmp and  lipid panel. Screening ps.  For elevated blood sugar get a1c.  Vaccine declined.  Recommend exercise and healthy diet.  We will let you know lab results as they come in.  Follow up date appointment will be determined after lab review.  But also would asked that you make appointment in 2 weeks so we can recheck your blood pressure on medication.  For GERD symptoms recommend bland healthy diet.  Avoid caffeinated beverages, fried foods, greasy foods and reducing alcohol should help as well. Continue famotadine/pepcid.  For small skin lesions on exam and scattered small moles went ahead and placed referral for dermatology evaluation.  For history of colon polyp, recent painful bowel movements and history of hemorrhoids with bright intermittent blood went ahead and placed referral to GI MD.  Asking your gastroenterologist to evaluate.  Not sure when you are due for repeat colonoscopy.  History of hypertension.  Advised on low-salt diet and prescribed losartan 50 mg daily.  Rx advisement given.  History of elevated liver enzymes.  With today's labs if enzymes are again elevated would recommend a repeat enzymes after 7 to 10 days of alcohol abstinence.  Preventive Care 29-39 Years Old, Male Preventive care refers to lifestyle choices and visits with your health care provider that can promote health and wellness. This includes: A yearly physical exam. This is also called an annual wellness visit. Regular dental and eye exams. Immunizations. Screening for certain conditions. Healthy lifestyle choices, such as: Eating a healthy diet. Getting regular exercise. Not using drugs or products that contain nicotine and tobacco. Limiting alcohol use. What can I expect for my preventive care visit? Physical exam Your health care provider will check your: Height and weight. These may be used to calculate your BMI (body mass index). BMI is a  measurement that tells if you are at a healthy weight. Heart rate and blood pressure. Body temperature. Skin for abnormal spots. Counseling Your health care provider may ask you questions about your: Past medical problems. Family's medical history. Alcohol, tobacco, and drug use. Emotional well-being. Home life and relationship well-being. Sexual activity. Diet, exercise, and sleep habits. Work and work Statistician. Access to firearms. What immunizations do I need? Vaccines are usually given at various ages, according to a schedule. Your health care provider will recommend vaccines for you based on your age, medical history, and lifestyle or other factors, such as travel or where you work. What tests do I need? Blood tests Lipid and cholesterol levels. These may be checked every 5 years, or more often if you are over 72 years old. Hepatitis C test. Hepatitis B test. Screening Lung cancer screening. You may have this screening every year starting at age 69 if you have a 30-pack-year history of smoking and currently smoke or have quit within the past 15 years. Prostate cancer screening. Recommendations will vary depending on your family history and other risks. Genital exam to check for testicular cancer or hernias. Colorectal cancer screening. All adults should have this screening starting at age 64 and continuing until age 87. Your health care provider may recommend screening at age 31 if you are at increased risk. You will have tests every 1-10 years, depending on your results and the type of screening test. Diabetes screening. This is done by checking your blood sugar (glucose) after you have not eaten for a while (fasting). You may have this done every 1-3 years. STD (sexually transmitted  disease) testing, if you are at risk. Follow these instructions at home: Eating and drinking  Eat a diet that includes fresh fruits and vegetables, whole grains, lean protein, and low-fat  dairy products. Take vitamin and mineral supplements as recommended by your health care provider. Do not drink alcohol if your health care provider tells you not to drink. If you drink alcohol: Limit how much you have to 0-2 drinks a day. Be aware of how much alcohol is in your drink. In the [place] one drink equals one 12 oz bottle of beer (355 mL), one 5 oz glass of wine (148 mL), or one 1 oz glass of hard liquor (44 mL). Lifestyle Take daily care of your teeth and gums. Brush your teeth every morning and night with fluoride toothpaste. Floss one time each day. Stay active. Exercise for at least 30 minutes 5 or more days each week. Do not use any products that contain nicotine or tobacco, such as cigarettes, e-cigarettes, and chewing tobacco. If you need help quitting, ask your health care provider. Do not use drugs. If you are sexually active, practice safe sex. Use a condom or other form of protection to prevent STIs (sexually transmitted infections). If told by your health care provider, take low-dose aspirin daily starting at age 26. Find healthy ways to cope with stress, such as: Meditation, yoga, or listening to music. Journaling. Talking to a trusted person. Spending time with friends and family. Safety Always wear your seat belt while driving or riding in a vehicle. Do not drive: If you have been drinking alcohol. Do not ride with someone who has been drinking. When you are tired or distracted. While texting. Wear a helmet and other protective equipment during sports activities. If you have firearms in your house, make sure you follow all gun safety procedures. What's next? Go to your health care provider once a year for an annual wellness visit. Ask your health care provider how often you should have your eyes and teeth checked. Stay up to date on all vaccines. This information is not intended to replace advice given to you by your health care provider. Make sure you discuss  any questions you have with your health care provider. Document Revised: 12/24/2020 Document Reviewed: 10/10/2018 Elsevier Patient Education  2022 [employer].

## 2021-07-29 NOTE — Addendum Note (Signed)
Addended by: Jeronimo Greaves on: 07/29/2021 08:41 AM   Modules accepted: Orders

## 2021-08-11 DIAGNOSIS — M25511 Pain in right shoulder: Secondary | ICD-10-CM | POA: Diagnosis not present

## 2021-08-12 ENCOUNTER — Ambulatory Visit: Payer: BC Managed Care – PPO

## 2021-08-12 ENCOUNTER — Other Ambulatory Visit: Payer: BC Managed Care – PPO

## 2021-08-12 NOTE — Progress Notes (Deleted)
Pt here for Blood pressure check per Mackie Pai, PA-C  Pt currently takes: Losartan 50 mg- started 07/26/21   Pt reports compliance with medication.   BP today @ = HR =  Pt advised per ES:

## 2021-09-08 ENCOUNTER — Encounter: Payer: Self-pay | Admitting: Medical

## 2021-09-15 DIAGNOSIS — M25511 Pain in right shoulder: Secondary | ICD-10-CM | POA: Diagnosis not present

## 2021-09-15 DIAGNOSIS — M25551 Pain in right hip: Secondary | ICD-10-CM | POA: Diagnosis not present

## 2021-09-26 DIAGNOSIS — M25551 Pain in right hip: Secondary | ICD-10-CM | POA: Diagnosis not present

## 2021-09-28 IMAGING — MR MR CERVICAL SPINE W/O CM
5 series · 33 of 48 positions shown · non-contrast
Comparison: Cervical spine radiographs 11/03/2020

CLINICAL DATA: Neck pain radiating to the right arm since riding a
roller coaster in [DATE].

EXAM:
MRI CERVICAL SPINE WITHOUT CONTRAST
TECHNIQUE: Multiplanar, multisequence MR imaging of the cervical spine was
performed. No intravenous contrast was administered.

[Series 2: T2 · sagittal · 3.0mm · 0.43mm/px · 7 of 19 slices shown (1 of 2)]
[im 1/19]
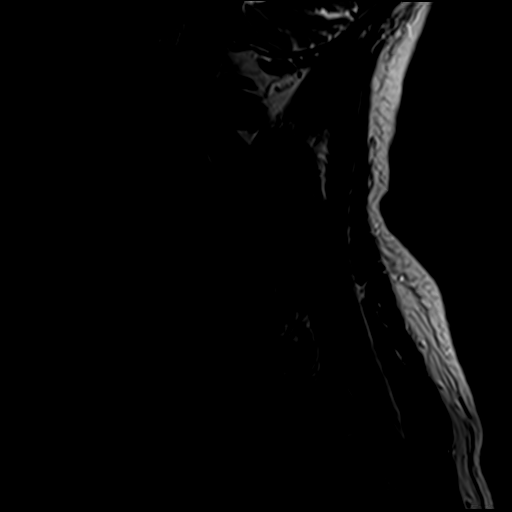
[im 4/19]
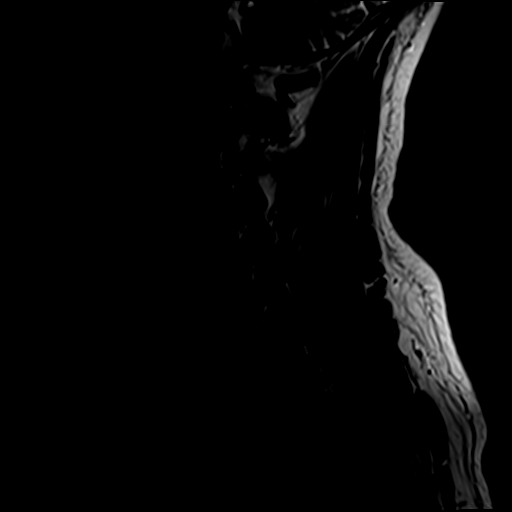
[im 7/19]
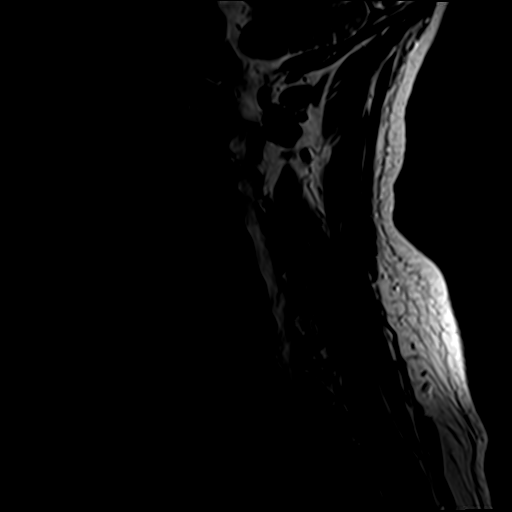
[im 10/19]
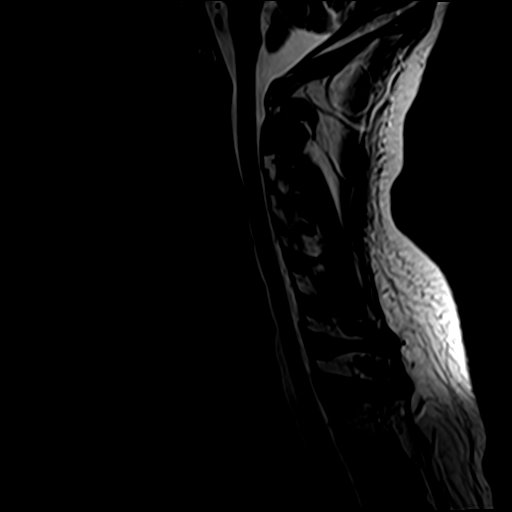
[im 13/19]
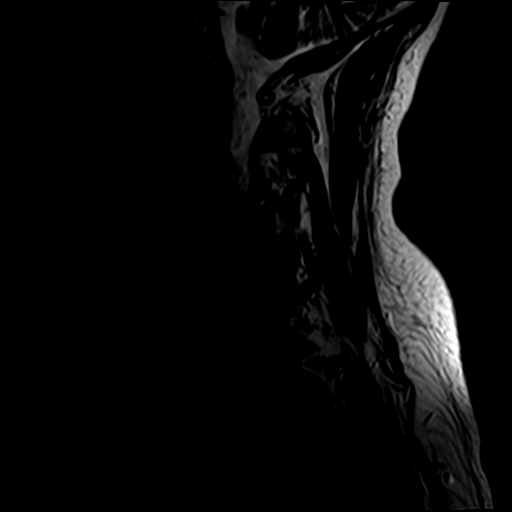
[im 16/19]
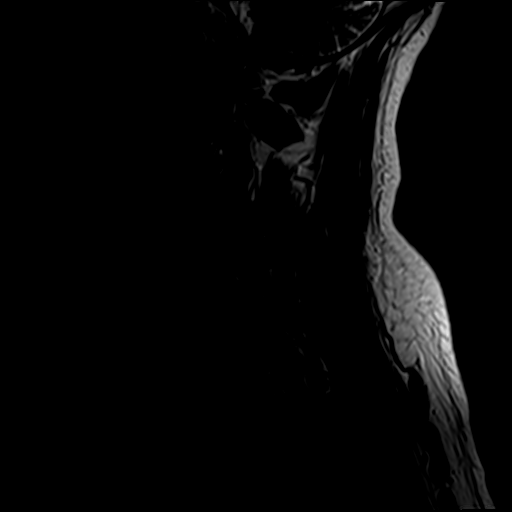
[im 19/19]
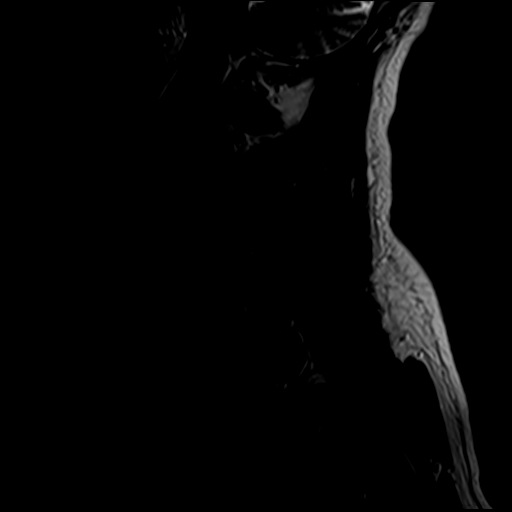

[Series 3: STIR · sagittal · 3.0mm · 0.86mm/px · 7 of 19 slices shown]
[im 1/19]
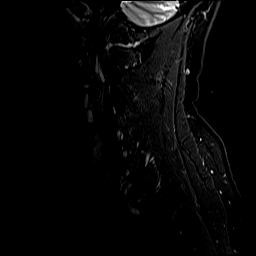
[im 4/19]
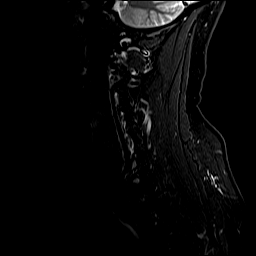
[im 7/19]
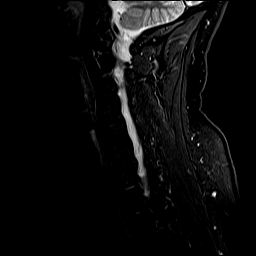
[im 10/19]
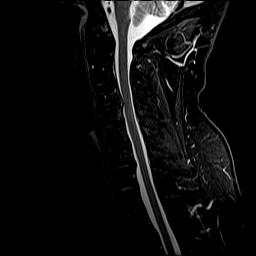
[im 13/19]
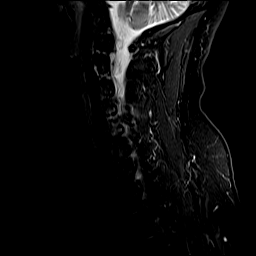
[im 16/19]
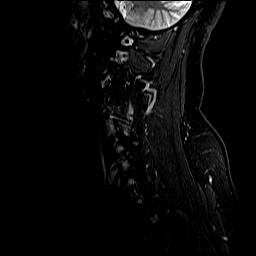
[im 19/19]
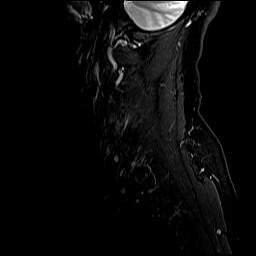

[Series 4: T1 · sagittal · 3.0mm · 0.86mm/px · 6 of 19 slices shown]
[im 1/19]
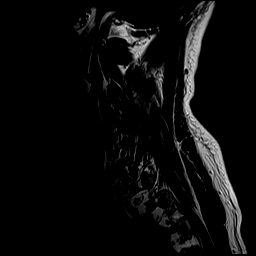
[im 4/19]
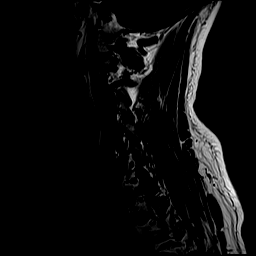
[im 8/19]
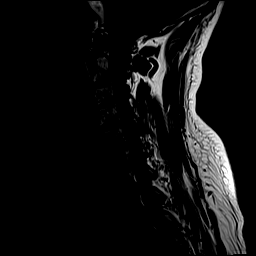
[im 11/19]
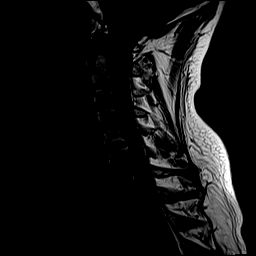
[im 15/19]
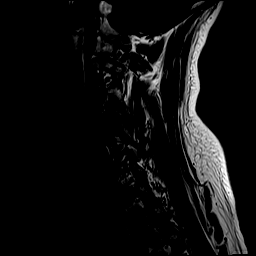
[im 19/19]
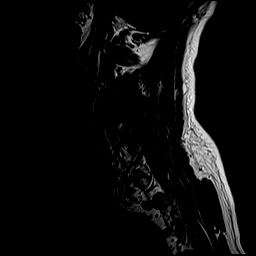

[Series 5: T2 · axial · 3.0mm · 0.70mm/px · z∈[-101,+52]mm · 8 of 42 slices shown (2 of 2)]
[im 1/42]
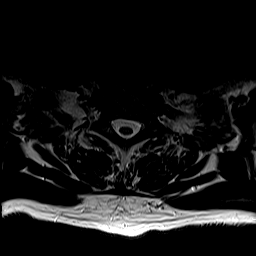
[im 7/42]
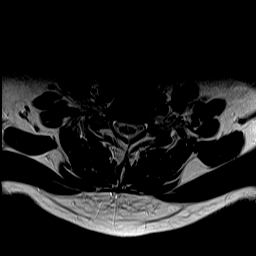
[im 13/42]
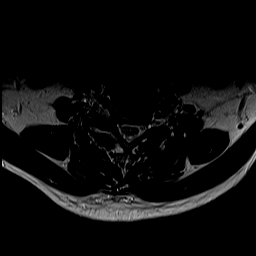
[im 19/42]
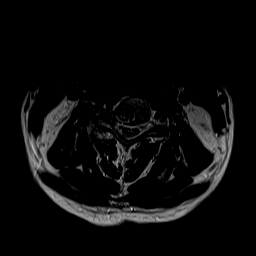
[im 23/42]
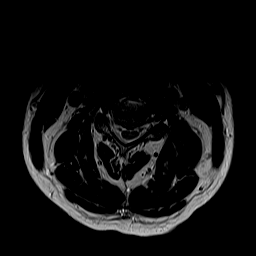
[im 29/42]
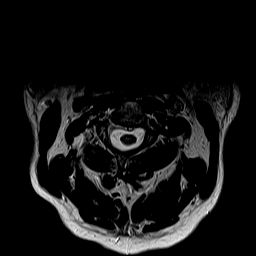
[im 35/42]
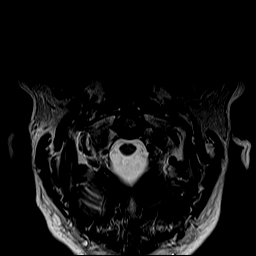
[im 42/42]
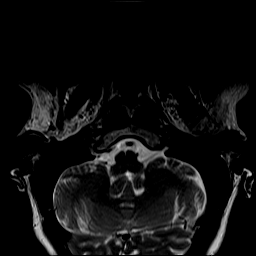

[Series 6: GRE · axial · 3.0mm · 0.35mm/px · z∈[-101,-19]mm · 5 of 42 slices shown]
[im 1/42]
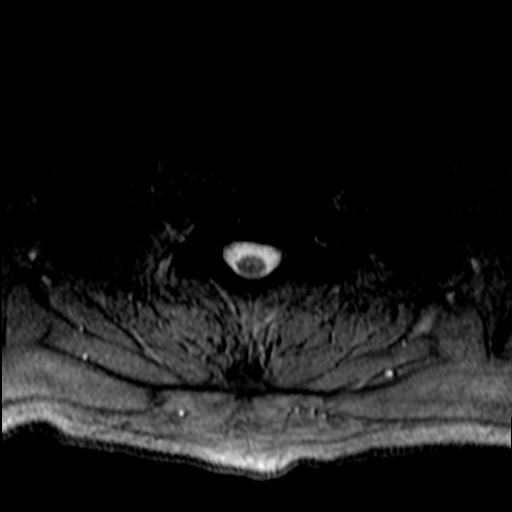
[im 7/42]
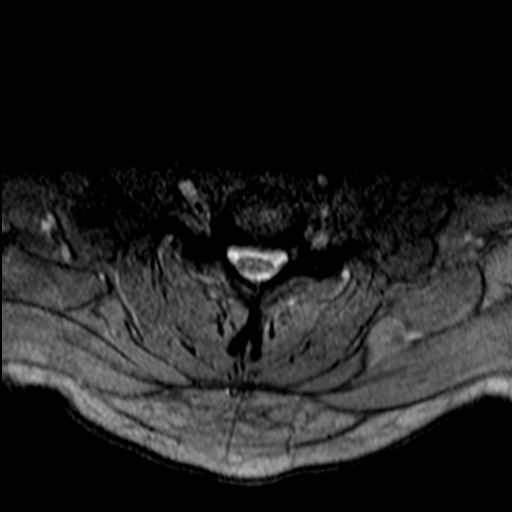
[im 13/42]
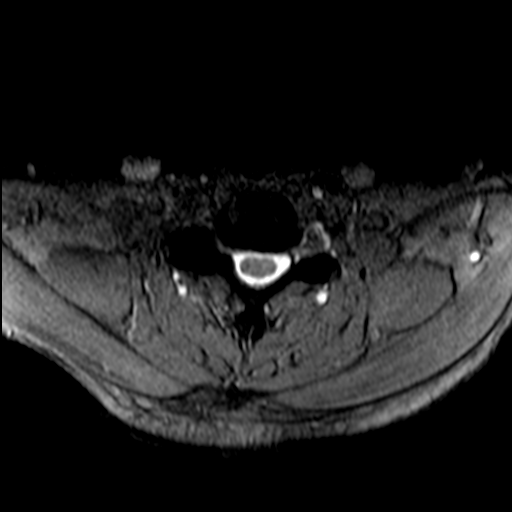
[im 19/42]
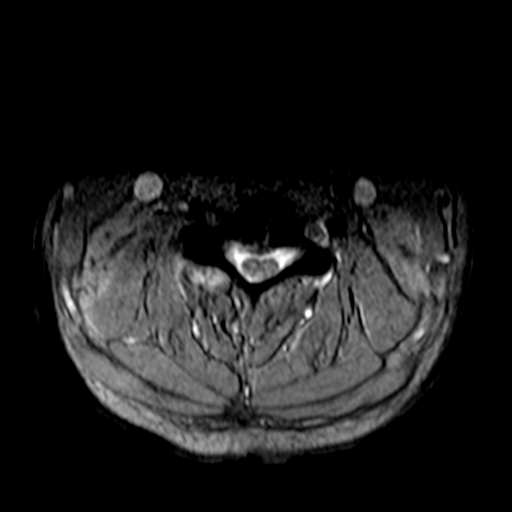
[im 23/42]
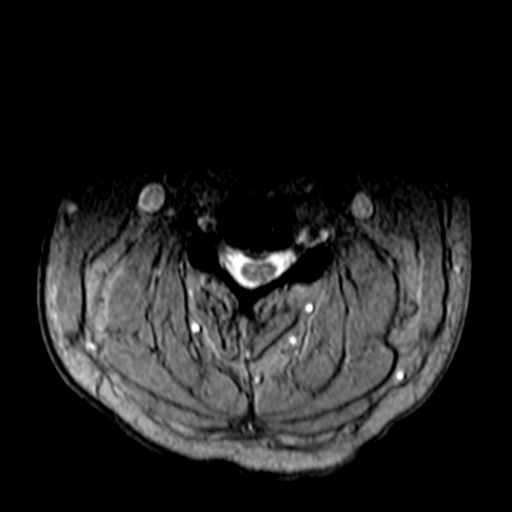

[33 of 48 positions shown; findings below may reference images not displayed]

FINDINGS: Alignment: Cervical spine straightening. Trace retrolisthesis of C3
on C4 and trace anterolisthesis of C4 on C5.

Vertebrae: No fracture or suspicious osseous lesion. Prominent
right-sided facet edema at C4-5. Mild right-sided degenerative
endplate edema at C4-5.

Cord: Normal signal.

Posterior Fossa, vertebral arteries, paraspinal tissues:
Unremarkable.

Disc levels:

C2-3: Negative.

C3-4: A small central disc extrusion results in mild-to-moderate
spinal stenosis and mild cord flattening. Asymmetric right
uncovertebral spurring results in mild right neural foraminal
stenosis.

C4-5: Anterolisthesis with mild bulging of uncovered disc, right
uncovertebral spurring, and severe right facet arthrosis result in
moderate right neural foraminal stenosis with potential right C5
nerve root impingement. No spinal stenosis.

C5-6: Mild disc bulging, right uncovertebral spurring, and severe
right facet arthrosis result and moderate right neural foraminal
stenosis potential right C6 nerve root impingement. No spinal
stenosis.

C6-7: Right eccentric disc bulging results in moderate right neural
foraminal stenosis with potential right C7 nerve root impingement.
No spinal stenosis.

C7-T1: Mild facet arthrosis without stenosis.
IMPRESSION: 1. Severe right facet arthrosis at C4-5 with prominent facet edema,
trace anterolisthesis, and moderate right neural foraminal stenosis.
2. Moderate right neural foraminal stenosis at C5-6 and C6-7.
3. Mild-to-moderate spinal stenosis at C3-4.

## 2021-10-03 ENCOUNTER — Ambulatory Visit: Payer: BC Managed Care – PPO | Admitting: Physician Assistant

## 2021-10-03 DIAGNOSIS — M25551 Pain in right hip: Secondary | ICD-10-CM | POA: Diagnosis not present

## 2021-10-03 DIAGNOSIS — Z789 Other specified health status: Secondary | ICD-10-CM | POA: Diagnosis not present

## 2021-10-03 DIAGNOSIS — M6281 Muscle weakness (generalized): Secondary | ICD-10-CM | POA: Diagnosis not present

## 2021-10-03 DIAGNOSIS — Z7409 Other reduced mobility: Secondary | ICD-10-CM | POA: Diagnosis not present

## 2021-10-07 DIAGNOSIS — Z7409 Other reduced mobility: Secondary | ICD-10-CM | POA: Diagnosis not present

## 2021-10-07 DIAGNOSIS — M6281 Muscle weakness (generalized): Secondary | ICD-10-CM | POA: Diagnosis not present

## 2021-10-07 DIAGNOSIS — Z789 Other specified health status: Secondary | ICD-10-CM | POA: Diagnosis not present

## 2021-10-07 DIAGNOSIS — M25551 Pain in right hip: Secondary | ICD-10-CM | POA: Diagnosis not present

## 2021-10-10 DIAGNOSIS — M6281 Muscle weakness (generalized): Secondary | ICD-10-CM | POA: Diagnosis not present

## 2021-10-10 DIAGNOSIS — M25551 Pain in right hip: Secondary | ICD-10-CM | POA: Diagnosis not present

## 2021-10-10 DIAGNOSIS — Z7409 Other reduced mobility: Secondary | ICD-10-CM | POA: Diagnosis not present

## 2021-10-10 DIAGNOSIS — Z789 Other specified health status: Secondary | ICD-10-CM | POA: Diagnosis not present

## 2021-10-12 DIAGNOSIS — M25511 Pain in right shoulder: Secondary | ICD-10-CM | POA: Diagnosis not present

## 2021-10-12 DIAGNOSIS — M67813 Other specified disorders of tendon, right shoulder: Secondary | ICD-10-CM | POA: Diagnosis not present

## 2021-10-14 DIAGNOSIS — M6281 Muscle weakness (generalized): Secondary | ICD-10-CM | POA: Diagnosis not present

## 2021-10-14 DIAGNOSIS — Z7409 Other reduced mobility: Secondary | ICD-10-CM | POA: Diagnosis not present

## 2021-10-14 DIAGNOSIS — Z789 Other specified health status: Secondary | ICD-10-CM | POA: Diagnosis not present

## 2021-10-14 DIAGNOSIS — M25551 Pain in right hip: Secondary | ICD-10-CM | POA: Diagnosis not present

## 2021-10-19 DIAGNOSIS — D1801 Hemangioma of skin and subcutaneous tissue: Secondary | ICD-10-CM | POA: Diagnosis not present

## 2021-10-19 DIAGNOSIS — L918 Other hypertrophic disorders of the skin: Secondary | ICD-10-CM | POA: Diagnosis not present

## 2021-10-19 DIAGNOSIS — L281 Prurigo nodularis: Secondary | ICD-10-CM | POA: Diagnosis not present

## 2021-10-19 DIAGNOSIS — L738 Other specified follicular disorders: Secondary | ICD-10-CM | POA: Diagnosis not present

## 2021-10-24 ENCOUNTER — Other Ambulatory Visit: Payer: Self-pay | Admitting: Orthopaedic Surgery

## 2021-10-27 ENCOUNTER — Ambulatory Visit: Payer: BC Managed Care – PPO | Admitting: Physician Assistant

## 2021-11-03 DIAGNOSIS — Z6827 Body mass index (BMI) 27.0-27.9, adult: Secondary | ICD-10-CM | POA: Diagnosis not present

## 2021-11-03 DIAGNOSIS — M5412 Radiculopathy, cervical region: Secondary | ICD-10-CM | POA: Diagnosis not present

## 2021-11-03 DIAGNOSIS — I10 Essential (primary) hypertension: Secondary | ICD-10-CM | POA: Diagnosis not present

## 2021-11-16 ENCOUNTER — Ambulatory Visit (INDEPENDENT_AMBULATORY_CARE_PROVIDER_SITE_OTHER): Payer: BC Managed Care – PPO | Admitting: Gastroenterology

## 2021-11-16 ENCOUNTER — Encounter: Payer: Self-pay | Admitting: Gastroenterology

## 2021-11-16 VITALS — BP 138/90 | HR 93 | Ht 71.0 in | Wt 202.4 lb

## 2021-11-16 DIAGNOSIS — K6289 Other specified diseases of anus and rectum: Secondary | ICD-10-CM

## 2021-11-16 DIAGNOSIS — R12 Heartburn: Secondary | ICD-10-CM | POA: Diagnosis not present

## 2021-11-16 DIAGNOSIS — K648 Other hemorrhoids: Secondary | ICD-10-CM

## 2021-11-16 NOTE — Patient Instructions (Addendum)
If you are age 50 or older, your body mass index should be between 23-30. Your Body mass index is 28.23 kg/m. If this is out of the aforementioned range listed, please consider follow up with your Primary Care Provider.  If you are age 55 or younger, your body mass index should be between 19-25. Your Body mass index is 28.23 kg/m. If this is out of the aformentioned range listed, please consider follow up with your Primary Care Provider.   ________________________________________________________  The  GI providers would like to encourage you to use [facility] to communicate with providers for non-urgent requests or questions.  Due to long hold times on the telephone, sending your provider a message by [facility] may be a faster and more efficient way to get a response.  Please allow 48 business hours for a response.  Please remember that this is for non-urgent requests.  _______________________________________________________  Please call the office at [phone] to schedule the hemorrhoid banding when you are ready.  It was a pleasure to see you today!  Thank you for trusting me with your gastrointestinal care!    _________________________________________   _______________________________________________________  Food Guidelines for those with chronic digestive trouble:  Many people have difficulty digesting certain foods, causing a variety of distressing and embarrassing symptoms such as abdominal pain, bloating and gas.  These foods may need to be avoided or consumed in small amounts.  Here are some tips that might be helpful for you.  1.   Lactose intolerance is the difficulty or complete inability to digest lactose, the natural sugar in milk and anything made from milk.  This condition is harmless, common, and can begin any time during life.  Some people can digest a modest amount of lactose while others cannot tolerate any.  Also, not all dairy products contain equal amounts of  lactose.  For example, hard cheeses such as parmesan have less lactose than soft cheeses such as cheddar.  Yogurt has less lactose than milk or cheese.  Many packaged foods (even many brands of bread) have milk, so read ingredient lists carefully.  It is difficult to test for lactose intolerance, so just try avoiding lactose as much as possible for a week and see what happens with your symptoms.  If you seem to be lactose intolerant, the best plan is to avoid it (but make sure you get calcium from another source).  The next best thing is to use lactase enzyme supplements, available over the counter everywhere.  Just know that many lactose intolerant people need to take several tablets with each serving of dairy to avoid symptoms.  Lastly, a lot of restaurant food is made with milk or butter.  Many are things you might not suspect, such as mashed potatoes, rice and pasta (cooked with butter) and "grilled" items.  If you are lactose intolerant, it never hurts to ask your server what has milk or butter.  2.   Fiber is an important part of your diet, but not all fiber is well-tolerated.  Insoluble fiber such as bran is often consumed by normal gut bacteria and converted into gas.  Soluble fiber such as oats, squash, carrots and green beans are typically tolerated better.  3.   Some types of carbohydrates can be poorly digested.  Examples include: fructose (apples, cherries, pears, raisins and other dried fruits), fructans (onions, zucchini, large amounts of wheat), sorbitol/mannitol/xylitol and sucralose/Splenda (common artificial sweeteners), and raffinose (lentils, broccoli, cabbage, asparagus, brussel sprouts, many types of beans).  Do a Development worker, community for [employer] and you will find helpful information. Beano, a dietary supplement, will often help with raffinose-containing foods.  As with lactase tablets, you may need several per serving.  4.   Whenever possible, avoid processed food&meats and chemical  additives.  High fructose corn syrup, a common sweetener, may be difficult to digest.  Eggs and soy (comes from the soybean, and added to many foods now) are other common bloating/gassy foods.  5.  Regarding gluten:  gluten is a protein mainly found in wheat, but also rye and barley.  There is a condition called celiac sprue, which is an inflammatory reaction in the small intestine causing a variety of digestive symptoms.  Blood testing is highly reliable to look for this condition, and sometimes upper endoscopy with small bowel biopsies may be necessary to make the diagnosis.  Many patients who test negative for celiac sprue report improvement in their digestive symptoms when they switch to a gluten-free diet.  However, in these "non-celiac gluten sensitive" patients, the true role of gluten in their symptoms is unclear.  Reducing carbohydrates in general may decrease the gas and bloating caused when gut bacteria consume carbs. Also, some of these patients may actually be intolerant of the baker's yeast in bread products rather than the gluten.  Flatbread and other reduced yeast breads might therefore be tolerated.  There is no specific testing available for most food intolerances, which are discovered mainly by dietary elimination.  Please do not embark on a gluten free diet unless directed by your doctor, as it is highly restrictive, and may lead to nutritional deficiencies if not carefully monitored.  Lastly, beware of internet claims offering "personalized" tests for food intolerances.  Such testing has no reliable scientific evidence to support its reliability and correlation to symptoms.    6.  The best advice is old advice, especially for those with chronic digestive trouble - try to eat "clean".  Balanced diet, avoid processed food, plenty of fruits and vegetables, cut down the sugar, minimal alcohol, avoid tobacco. Make time to care for yourself, get enough sleep, exercise when you can, reduce stress.   Your guts will thank you for it.   - Dr. [facility]  ____________________________________________________________

## 2021-11-16 NOTE — Progress Notes (Signed)
[facility] Consult Note:  History: Christopher Andersen 11/16/2021  Referring provider: Mackie Pai, PA-C  Reason for consult/chief complaint: Rectal Pain (1 episode of painful BM), Hemorrhoids (Seeing blood in the toilet, about every 2 weeks), Rectal Bleeding, Gastroesophageal Reflux (Had tried omeprazole but tries not to take it.  Uses TUMs or Pepcid as needed), and Colonoscopy (Last in 2019, not due until 2029)   Subjective  HPI: I saw [name] in May 2019 for longstanding rectal bleeding as well as more recent diarrhea and crampy abdominal pain.  Colonoscopy normal except internal hemorrhoids, prescribed hyoscyamine and recommendation for hemorrhoidal banding if control of diarrhea did not cease the rectal bleeding.  Incidental diminutive left colon polyp removed, pathology revealed normal colon tissue, 10-year recall recommended. _______________  About 2 months ago Christopher Andersen had an episode of difficulty passing a BM with straining (which is unusual for him), and he had anal pain that lasted a few days and resolved.  He saw his PCP, and said he had an exam and it was normal.  No further episodes like that, but he has continued to have hemorrhoidal bleeding once or twice a week.  He still has episodes of urgency and loose stool.  Is also developed reflux symptoms with intermittent heartburn or regurgitation, typically if he eats late in the evening or certain offending foods.  He denies dysphagia, odynophagia, nausea vomiting, early satiety or weight loss. ROS:  He denies chest pain dyspnea or dysuria   Past Medical History: Past Medical History:  Diagnosis Date   Anxiety    Depression    Hypertension      Past Surgical History: Past Surgical History:  Procedure Laterality Date   LIPOMA EXCISION  2005   face   TONSILLECTOMY       Family History: Family History  Problem Relation Age of Onset   Dementia Mother    Hypertension Mother    Ulcers Mother     Diabetes Father    Non-Hodgkin's lymphoma Father    Colon cancer Neg Hx    Stomach cancer Neg Hx    Esophageal cancer Neg Hx     Social History: Social History   Socioeconomic History   Marital status: Divorced    Spouse name: Not on file   Number of children: 8   Years of education: Not on file   Highest education level: Not on file  Occupational History   Occupation: [employer]  Tobacco Use   Smoking status: Never   Smokeless tobacco: Never  Vaping Use   Vaping Use: Never used  Substance and Sexual Activity   Alcohol use: Yes    Comment: 3-5 beers daily.   Drug use: No   Sexual activity: Yes  Other Topics Concern   Not on file  Social History Narrative   Not on file   Social Determinants of Health   Financial Resource Strain: Not on file  Food Insecurity: Not on file  Transportation Needs: Not on file  Physical Activity: Not on file  Stress: Not on file  Social Connections: Not on file    Allergies: No Known Allergies  Outpatient Meds: Current Outpatient Medications  Medication Sig Dispense Refill   cyclobenzaprine (FLEXERIL) 10 MG tablet TAKE 1 TABLET BY MOUTH EVERYDAY AT BEDTIME (Patient taking differently: as needed.) 30 tablet 0   imiquimod (ALDARA) 5 % cream Apply topically as needed.     losartan (COZAAR) 50 MG tablet Take 1 tablet (50 mg total) by  mouth daily. 30 tablet 3   Current Facility-Administered Medications  Medication Dose Route Frequency Provider Last Rate Last Admin   0.9 %  sodium chloride infusion  500 mL Intravenous Once Doran Stabler, MD          ___________________________________________________________________ Objective   Exam:  BP 138/90    Pulse 93    Ht 5\' 11"  (1.803 m)    Wt 202 lb 6.4 oz (91.8 kg)    SpO2 97%    BMI 28.23 kg/m  Wt Readings from Last 3 Encounters:  11/16/21 202 lb 6.4 oz (91.8 kg)  07/26/21 196 lb (88.9 kg)  05/10/20 198 lb (89.8 kg)    General: Well-appearing Eyes: sclera  anicteric, no redness ENT: oral mucosa moist without lesions, no cervical or supraclavicular lymphadenopathy CV: RRR without murmur, S1/S2, no JVD, no peripheral edema Resp: clear to auscultation bilaterally, normal RR and effort noted GI: soft, no tenderness, with active bowel sounds. No guarding or palpable organomegaly noted. Rectal: Normal perianal exam, normal DRE without fissure, tenderness or palpable internal lesion Anoscopy with internal hemorrhoids, RP column most prominent  Labs:  Encounter Diagnoses  Name Primary?   Anal pain Yes   Bleeding internal hemorrhoids    Heartburn     Painful bowel movement 2 months ago, could have had a small anal tear, long since healed, no fissure on exam today.  He has ongoing intermittent internal hemorrhoidal bleeding.  I discussed the option of hemorrhoidal banding, showed him a diagram of the anatomy and give him a brochure.  He would like to consider it further but will contact us if he decides to proceed.  Recent reflux symptoms for which she is doing his best to engage in diet and lifestyle measures and avoid triggers.  His regular work schedule often leads to him eating later in the evening that he would like.  Suggested try to have largest meal at lunch rather than supper.  He is currently taking PPI with the evening meal, has no red flag symptoms and I do not feel he needs an upper endoscopy at this juncture.  He was encouraged to call me if symptoms are worsening, persistent despite antireflux diet and lifestyle measures and antacid medicine, development of dysphagia, nausea vomiting weight loss or other symptoms of concern.   [name]  CC: Referring provider noted above

## 2021-11-27 ENCOUNTER — Other Ambulatory Visit: Payer: Self-pay | Admitting: Medical

## 2021-12-07 ENCOUNTER — Encounter: Payer: Self-pay | Admitting: Orthopaedic Surgery

## 2021-12-07 ENCOUNTER — Other Ambulatory Visit: Payer: Self-pay

## 2021-12-07 ENCOUNTER — Ambulatory Visit (INDEPENDENT_AMBULATORY_CARE_PROVIDER_SITE_OTHER): Payer: BC Managed Care – PPO | Admitting: Orthopaedic Surgery

## 2021-12-07 VITALS — Ht 72.25 in | Wt 201.6 lb

## 2021-12-07 DIAGNOSIS — M25551 Pain in right hip: Secondary | ICD-10-CM

## 2021-12-07 NOTE — Progress Notes (Signed)
The patient comes in today with 9 months worth of worsening right hip pain and the pain is in the groin.  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  He had had an injury to that hip in the last year where he performed a split accidentally falling and has had pain since then.  Even employs of notices limping.  He was placed on prednisone and this did help for a little bit.  He has been through physical therapy as well with him working on strengthening the muscles around his right hip.  It is worse with activities as well.  He is not a diabetic.  He is never had a hip surgery or injection in his right hip but he has again been on prednisone and anti-inflammatories as well as worked on outpatient physical therapy and activity modification.  He currently denies any headache, chest pain, shortness of breath, fever, chills, nausea, vomiting.  On exam his left hip moves smoothly and fluidly with full rotation.  The right hip is more still with internal and external rotation has significant pain with extremes of internal/external rotation in the groin area.  X-rays on the canopy system reviewed of his pelvis and both hips.  There is a osteophyte off the lateral femoral head and neck area and feel that the cartilage looks a little irregular in terms of the weightbearing surface of the femoral head.  This is concerning enough on his exam that I am concerned about a labral tear and even potentially avascular necrosis.  Given the failed conservative treatment over 9 months including activity modification, anti-inflammatories, steroids and outpatient physical therapy, a MRI arthrogram of the right hip is now warranted to rule out a labral tear and to assess the cartilage.  He will continue his same treatment regimens until we have this MRI and we will see him back in follow-up to go over the MRI results.  All questions and  concerns were answered and addressed.

## 2021-12-09 ENCOUNTER — Telehealth: Payer: Self-pay | Admitting: Orthopaedic Surgery

## 2021-12-09 NOTE — Telephone Encounter (Signed)
Called patient left message to return call to schedule an MRI review with Dr Ninfa Linden-    MRI schedule for 12/30/2021

## 2021-12-27 ENCOUNTER — Other Ambulatory Visit: Payer: BC Managed Care – PPO

## 2021-12-30 ENCOUNTER — Other Ambulatory Visit: Payer: BC Managed Care – PPO

## 2021-12-30 ENCOUNTER — Inpatient Hospital Stay: Admission: RE | Admit: 2021-12-30 | Payer: BC Managed Care – PPO | Source: Ambulatory Visit

## 2022-01-02 ENCOUNTER — Ambulatory Visit: Payer: BC Managed Care – PPO | Admitting: Orthopaedic Surgery

## 2022-01-06 ENCOUNTER — Ambulatory Visit (INDEPENDENT_AMBULATORY_CARE_PROVIDER_SITE_OTHER): Payer: BC Managed Care – PPO | Admitting: Medical

## 2022-01-06 VITALS — BP 134/80 | HR 78 | Resp 18 | Ht 72.0 in | Wt 203.6 lb

## 2022-01-06 DIAGNOSIS — R591 Generalized enlarged lymph nodes: Secondary | ICD-10-CM

## 2022-01-06 DIAGNOSIS — R1903 Right lower quadrant abdominal swelling, mass and lump: Secondary | ICD-10-CM

## 2022-01-06 LAB — CBC WITH DIFFERENTIAL/PLATELET
Basophils Absolute: 0 10*3/uL (ref 0.0–0.1)
Basophils Relative: 0.7 % (ref 0.0–3.0)
Eosinophils Absolute: 0.3 10*3/uL (ref 0.0–0.7)
Eosinophils Relative: 4.2 % (ref 0.0–5.0)
HCT: 43.9 % (ref 39.0–52.0)
Hemoglobin: 15 g/dL (ref 13.0–17.0)
Lymphocytes Relative: 29.9 % (ref 12.0–46.0)
Lymphs Abs: 1.9 10*3/uL (ref 0.7–4.0)
MCHC: 34.2 g/dL (ref 30.0–36.0)
MCV: 92.9 fl (ref 78.0–100.0)
Monocytes Absolute: 0.8 10*3/uL (ref 0.1–1.0)
Monocytes Relative: 13.2 % — ABNORMAL HIGH (ref 3.0–12.0)
Neutro Abs: 3.3 10*3/uL (ref 1.4–7.7)
Neutrophils Relative %: 52 % (ref 43.0–77.0)
Platelets: 230 10*3/uL (ref 150.0–400.0)
RBC: 4.73 Mil/uL (ref 4.22–5.81)
RDW: 13.6 % (ref 11.5–15.5)
WBC: 6.3 10*3/uL (ref 4.0–10.5)

## 2022-01-06 NOTE — Patient Instructions (Signed)
Recent found lump on the right side just medial to anterior superior iliac crest.  On exam/palpation estimated size is about 2 to 2.5 cm in length and feels oblong.  Considering lipoma versus lymph node.  Minimal discomfort on direct palpation otherwise no pain.  Will get CBC today.  On discussion was considering getting ultrasound versus CT scan but you report in 2 weeks you are scheduled for right hip MRI.  I would think that this area would be included in hip MRI.  I would recommend that you mention the lump area to MRI I would recommend check and ask if they could include this in the view.  MRI does show soft tissue I think this would be basically the best study to evaluate this area.  If you have pain or worsening signs or symptoms during the 2-week interim please let me know. ? ?Follow-up at date to be determined after if imaging review.  Since I did not order the study please provide me with a copy.  If no comments made by radiologist then I could talk to radiologist to have them review image or suggest further studies. ? ?

## 2022-01-06 NOTE — Progress Notes (Signed)
? ?Subjective:  ? ? Patient ID: Christopher Andersen, male    DOB: 12-Jan-1971, 51 y.o.   MRN: [id number] ? ?HPI ? ?Pt in for small lump in abdomen that he felt on Monday. No pain in this area. No nausea, vomiting or fever.  ? ?Pt had colonoscopy before. Last I can see is 2019.  ?Gi note states last in 2019, not due until 2029) ? ? ?Mild tender to area on palpation during exam. ? ?Pt has hx of rt hip pain since November. He is scheduled for rt hip mri on January 20, 2022. Dr Rush Farmer.  ?  ? ?Review of Systems  ?Constitutional:  Negative for chills, fatigue and fever.  ?Respiratory:  Negative for cough, chest tightness, shortness of breath and wheezing.   ?Cardiovascular:  Negative for chest pain and palpitations.  ?Gastrointestinal:  Negative for abdominal pain, blood in stool, constipation, diarrhea and vomiting.  ?     See HPI and physical exam.  ?Genitourinary:  Negative for dysuria and flank pain.  ?Musculoskeletal:  Negative for back pain and gait problem.  ?Skin:  Negative for rash.  ?Neurological:  Negative for dizziness, facial asymmetry, light-headedness and headaches.  ?Hematological:  Negative for adenopathy. Does not bruise/bleed easily.  ?Psychiatric/Behavioral:  Negative for behavioral problems and decreased concentration.   ? ? ?Past Medical History:  ?Diagnosis Date  ? Anxiety   ? Depression   ? Hypertension   ? ?  ?Social History  ? ?Socioeconomic History  ? Marital status: Divorced  ?  Spouse name: Not on file  ? Number of children: 8  ? Years of education: Not on file  ? Highest education level: Not on file  ?Occupational History  ? Occupation: Education officer, community  ?Tobacco Use  ? Smoking status: Never  ? Smokeless tobacco: Never  ?Vaping Use  ? Vaping Use: Never used  ?Substance and Sexual Activity  ? Alcohol use: Yes  ?  Comment: 3-5 beers daily.  ? Drug use: No  ? Sexual activity: Yes  ?Other Topics Concern  ? Not on file  ?Social History Narrative  ? Not on file  ? ?Social Determinants of Health   ? ?Financial Resource Strain: Not on file  ?Food Insecurity: Not on file  ?Transportation Needs: Not on file  ?Physical Activity: Not on file  ?Stress: Not on file  ?Social Connections: Not on file  ?Intimate Partner Violence: Not on file  ? ? ?Past Surgical History:  ?Procedure Laterality Date  ? LIPOMA EXCISION  2005  ? face  ? TONSILLECTOMY    ? ? ?Family History  ?Problem Relation Age of Onset  ? Dementia Mother   ? Hypertension Mother   ? Ulcers Mother   ? Diabetes Father   ? Non-Hodgkin's lymphoma Father   ? Colon cancer Neg Hx   ? Stomach cancer Neg Hx   ? Esophageal cancer Neg Hx   ? ? ?No Known Allergies ? ?Current Outpatient Medications on File Prior to Visit  ?Medication Sig Dispense Refill  ? cyclobenzaprine (FLEXERIL) 10 MG tablet TAKE 1 TABLET BY MOUTH EVERYDAY AT BEDTIME (Patient taking differently: as needed.) 30 tablet 0  ? imiquimod (ALDARA) 5 % cream Apply topically as needed.    ? losartan (COZAAR) 50 MG tablet TAKE 1 TABLET BY MOUTH EVERY DAY 30 tablet 3  ? ?Current Facility-Administered Medications on File Prior to Visit  ?Medication Dose Route Frequency Provider Last Rate Last Admin  ? 0.9 %  sodium chloride infusion  500 mL Intravenous Once Doran Stabler, MD      ? ? ?BP 134/80   Pulse 78   Resp 18   Ht 6' (1.829 m)   Wt 203 lb 9.6 oz (92.4 kg)   SpO2 100%   BMI 27.61 kg/m?  ?  ?   ?Objective:  ? ? ?General- No acute distress. Pleasant patient. ?Neck- Full range of motion, no jvd ?Lungs- Clear, even and unlabored. ?Heart- regular rate and rhythm. ?Neurologic- CNII- XII grossly intact.  ? ?Abdomen-soft, nondistended, positive bowel sounds, no rebound or guarding.  No organomegaly.  On palpation just right of the anterior superior iliac crest there is a 2 to 2.5 cm oblong lump.  Feels like lipoma versus lymph node?. ? ?Genital exam-on inspection and palpation of both inguinal canals no hernia felt. ? ?   ?Assessment & Plan:  ? ?Patient Instructions  ?Recent found lump on the right  side just medial to anterior superior iliac crest.  On exam/palpation estimated size is about 2 to 2.5 cm in length and feels oblong.  Considering lipoma versus lymph node.  Minimal discomfort on direct palpation otherwise no pain.  Will get CBC today.  On discussion was considering getting ultrasound versus CT scan but you report in 2 weeks you are scheduled for right hip MRI.  I would think that this area would be included in hip MRI.  I would recommend that you mention the lump area to MRI I would recommend check and ask if they could include this in the view.  MRI does show soft tissue I think this would be basically the best study to evaluate this area.  If you have pain or worsening signs or symptoms during the 2-week interim please let me know. ? ?Follow-up at date to be determined after if imaging review.  Since I did not order the study please provide me with a copy.  If no comments made by radiologist then I could talk to radiologist to have them review image or suggest further studies. ?  ? ?Mackie Pai, PA-C  ? ?Time spent with patient today was  30 minutes which consisted of chart review, discussing diagnosis, work up and documentation.  ?

## 2022-01-18 ENCOUNTER — Encounter: Payer: Self-pay | Admitting: Medical

## 2022-01-23 ENCOUNTER — Ambulatory Visit: Payer: BC Managed Care – PPO | Admitting: Orthopaedic Surgery

## 2022-01-24 ENCOUNTER — Ambulatory Visit
Admission: RE | Admit: 2022-01-24 | Discharge: 2022-01-24 | Disposition: A | Discharge status: Home / Self Care | Payer: BC Managed Care – PPO | Source: Ambulatory Visit | Attending: Orthopaedic Surgery | Admitting: Orthopaedic Surgery

## 2022-01-24 ENCOUNTER — Other Ambulatory Visit: Payer: Self-pay

## 2022-01-24 DIAGNOSIS — M25551 Pain in right hip: Secondary | ICD-10-CM

## 2022-01-24 DIAGNOSIS — R600 Localized edema: Secondary | ICD-10-CM | POA: Diagnosis not present

## 2022-01-24 MED ORDER — IOPAMIDOL (ISOVUE-M 200) INJECTION 41%
15.0000 mL | Freq: Once | INTRAMUSCULAR | Status: AC
  Administered 2022-01-24: 15 mL via INTRA_ARTICULAR

## 2022-01-25 ENCOUNTER — Encounter: Payer: Self-pay | Admitting: Orthopaedic Surgery

## 2022-01-26 ENCOUNTER — Ambulatory Visit (INDEPENDENT_AMBULATORY_CARE_PROVIDER_SITE_OTHER): Payer: BC Managed Care – PPO | Admitting: Medical

## 2022-01-26 VITALS — BP 133/80 | HR 88 | Resp 18 | Ht 72.0 in | Wt 203.0 lb

## 2022-01-26 DIAGNOSIS — M542 Cervicalgia: Secondary | ICD-10-CM | POA: Diagnosis not present

## 2022-01-26 DIAGNOSIS — M4802 Spinal stenosis, cervical region: Secondary | ICD-10-CM

## 2022-01-26 DIAGNOSIS — R1904 Left lower quadrant abdominal swelling, mass and lump: Secondary | ICD-10-CM

## 2022-01-26 DIAGNOSIS — M87051 Idiopathic aseptic necrosis of right femur: Secondary | ICD-10-CM | POA: Diagnosis not present

## 2022-01-26 NOTE — Progress Notes (Signed)
? ?  Subjective:  ? ? Patient ID: ARLYNN MCDERMID, male    DOB: 1971-08-11, 51 y.o.   MRN: [id number] ? ?HPI ? ?Pt in for follow up. ? ?I filled out ADA form for neck pain and hip avascular necrosis. Specialist deferred to me rather than filling out. Moderate complex form. ? ?Pt on flexeril for pain. He has less pain using standing raised desk. ? ? ?Also her for follow up on abdomen lump. ? ?Pt  has small lump in abdomen that he felt on Monday. No pain in this area. No nausea, vomiting or fever.  ?  ?Pt had colonoscopy before. Last I can see is 2019.  ?Gi note states last in 2019, not due until 2029) ?  ?  ?Mild tender to area on palpation during exam. ?  ?Pt had recent mri of both hips and I had thought radiologist would have made comments as this is in region where would have thought was visitble. Thus last visit did not order additional studies as knew his mri was upcoming. Last visit wbc count normal. ? ? ? ?Review of Systems ?See hpi. ?   ?Objective:  ? Physical Exam ? ? ?General- No acute distress. Pleasant patient. ?Neck- Full range of motion, no jvd ?Lungs- Clear, even and unlabored. ?Heart- regular rate and rhythm. ?Neurologic- CNII- XII grossly intact.  ?  ?Abdomen-soft, nondistended, positive bowel sounds, no rebound or guarding.  No organomegaly.  On palpation just medial of the anterior superior iliac crest there is a 2 to 2.5 cm oblong lump.  Feels like lipoma versus lymph node?. ? ?   ?Assessment & Plan:  ? ?Patient Instructions  ?Avascular process of both hips with right side having moderate to severe pain daily basis.  Feels better using standing desk.  Filled out ADA form today during office visit.  Patient will continue Flexeril and did explain in the future could provide gabapentin for nerve pain if needed.  Also tramadol other potential option. ? ?Spinal stenosis cervical spine region.  If pain uses standing/raised desk neck pain is less as well.  Flexeril to use for this as well.  Explained  gabapentin or tramadol potential option for pain in the future. ? ?Left lower abdomen/pelvic region circular mass about 2 to 2.5 cm.  Feels oblong and soft.  I think probable lipoma.  On review of patient's MRI of bilateral hips no comments made regarding this area.  I had saw this would have been found by MRI?Marland Kitchen  Going to try to talk with the radiologist to read the images to get opinion on how to work this up further.  Considering a possible ultrasound directed over region?  Not sure if CT would see this region. ? ?Follow-up in 1 month or sooner if needed.  ? ?Mackie Pai, PA-C   ? ? ?Time spent with patient today was  45 minutes which consisted of chart review, filling out complex ADA form, discussing diagnoses, work up, treatment and documentation.  ?

## 2022-01-26 NOTE — Patient Instructions (Signed)
Avascular process of both hips with right side having moderate to severe pain daily basis.  Feels better using standing desk.  Filled out ADA form today during office visit.  Patient will continue Flexeril and did explain in the future could provide gabapentin for nerve pain if needed.  Also tramadol other potential option. ? ?Spinal stenosis cervical spine region.  If pain uses standing/raised desk neck pain is less as well.  Flexeril to use for this as well.  Explained gabapentin or tramadol potential option for pain in the future. ? ?Left lower abdomen/pelvic region circular mass about 2 to 2.5 cm.  Feels oblong and soft.  I think probable lipoma.  On review of patient's MRI of bilateral hips no comments made regarding this area.  I had saw this would have been found by MRI?Marland Kitchen  Going to try to talk with the radiologist to read the images to get opinion on how to work this up further.  Considering a possible ultrasound directed over region?  Not sure if CT would see this region. ? ?Follow-up in 1 month or sooner if needed. ?

## 2022-02-06 ENCOUNTER — Ambulatory Visit (INDEPENDENT_AMBULATORY_CARE_PROVIDER_SITE_OTHER): Payer: BC Managed Care – PPO | Admitting: Orthopaedic Surgery

## 2022-02-06 ENCOUNTER — Encounter: Payer: Self-pay | Admitting: Orthopaedic Surgery

## 2022-02-06 DIAGNOSIS — M87051 Idiopathic aseptic necrosis of right femur: Secondary | ICD-10-CM | POA: Insufficient documentation

## 2022-02-06 DIAGNOSIS — M87052 Idiopathic aseptic necrosis of left femur: Secondary | ICD-10-CM | POA: Diagnosis not present

## 2022-02-06 NOTE — Progress Notes (Signed)
Christopher Andersen comes in today to go over an MRI of his right hip.  He had had acute and rapid worsening pain of the right hip with a possible injury when climbing a truck and performing a split.  We sent him for an MRI arthrogram to rule out a labral tear given plain films that were not remarkable. ? ?Surprisingly MRI shows severe avascular necrosis of both his hips.  There is no femoral head collapse but there is AVN in both hips.  I went over these findings with him showing him the MRI report and going over the actual studies.  The AVN is significantly advanced. ? ?There is significant pain and stiffness with rotation of the right hip.  He seems to be more asymptomatic on the left hip. ? ?We talked about hip replacement surgery and my recommendation for a right hip replacement.  Given the rapid onset of his pain this will certainly continue to get worse.  He is not obese and is not a diabetic.  We talked about the risks and benefits of surgery.  I went over hip replacement model.  I spoke about what to expect during an intraoperative and postoperative course.  All questions and concerns were answered and addressed.  He has a hip replacement handout as well.  We will work on getting this scheduled. ?

## 2022-02-07 ENCOUNTER — Encounter: Payer: Self-pay | Admitting: Medical

## 2022-02-07 NOTE — Addendum Note (Signed)
Addended by: Anabel Halon on: 02/07/2022 06:16 PM ? ? Modules accepted: Orders ? ?

## 2022-02-17 ENCOUNTER — Encounter: Payer: Self-pay | Admitting: Orthopaedic Surgery

## 2022-02-17 ENCOUNTER — Other Ambulatory Visit: Payer: Self-pay | Admitting: Orthopaedic Surgery

## 2022-02-17 ENCOUNTER — Ambulatory Visit (HOSPITAL_COMMUNITY)
Admission: RE | Admit: 2022-02-17 | Discharge: 2022-02-17 | Disposition: A | Discharge status: Home / Self Care | Payer: BC Managed Care – PPO | Source: Ambulatory Visit | Attending: Medical | Admitting: Medical

## 2022-02-17 DIAGNOSIS — R1904 Left lower quadrant abdominal swelling, mass and lump: Secondary | ICD-10-CM

## 2022-02-17 MED ORDER — TRAMADOL HCL 50 MG PO TABS: 50.0000 mg | ORAL_TABLET | Freq: Four times a day (QID) | ORAL | 0 refills | Status: DC | PRN

## 2022-02-17 MED ORDER — CELECOXIB 200 MG PO CAPS: 200.0000 mg | ORAL_CAPSULE | Freq: Two times a day (BID) | ORAL | 3 refills | Status: DC | PRN

## 2022-02-27 ENCOUNTER — Other Ambulatory Visit: Payer: Self-pay | Admitting: Medical

## 2022-03-02 ENCOUNTER — Other Ambulatory Visit: Payer: Self-pay | Admitting: Orthopaedic Surgery

## 2022-03-06 ENCOUNTER — Encounter: Payer: Self-pay | Admitting: Orthopaedic Surgery

## 2022-03-08 ENCOUNTER — Other Ambulatory Visit: Payer: Self-pay

## 2022-03-17 ENCOUNTER — Other Ambulatory Visit: Payer: Self-pay | Admitting: Orthopaedic Surgery

## 2022-03-17 ENCOUNTER — Telehealth: Payer: Self-pay | Admitting: Orthopaedic Surgery

## 2022-03-17 NOTE — Telephone Encounter (Signed)
Pt submitted 2 medical release forms, intermittent leave forms, Disability forms, and $50.00 check payment to [employer]. Accepted 03/17/22

## 2022-03-22 ENCOUNTER — Encounter: Payer: Self-pay | Admitting: Medical

## 2022-03-31 ENCOUNTER — Telehealth: Payer: Self-pay | Admitting: Medical

## 2022-03-31 ENCOUNTER — Other Ambulatory Visit: Payer: Self-pay | Admitting: Orthopaedic Surgery

## 2022-03-31 ENCOUNTER — Ambulatory Visit (INDEPENDENT_AMBULATORY_CARE_PROVIDER_SITE_OTHER): Payer: BC Managed Care – PPO | Admitting: Medical

## 2022-03-31 VITALS — BP 127/85 | HR 75 | Resp 18 | Ht 72.0 in | Wt 202.0 lb

## 2022-03-31 DIAGNOSIS — R1900 Intra-abdominal and pelvic swelling, mass and lump, unspecified site: Secondary | ICD-10-CM

## 2022-03-31 DIAGNOSIS — R591 Generalized enlarged lymph nodes: Secondary | ICD-10-CM

## 2022-03-31 DIAGNOSIS — D179 Benign lipomatous neoplasm, unspecified: Secondary | ICD-10-CM

## 2022-03-31 DIAGNOSIS — R748 Abnormal levels of other serum enzymes: Secondary | ICD-10-CM

## 2022-03-31 LAB — COMPREHENSIVE METABOLIC PANEL
ALT: 29 U/L (ref 0–53)
AST: 25 U/L (ref 0–37)
Albumin: 4.5 g/dL (ref 3.5–5.2)
Alkaline Phosphatase: 54 U/L (ref 39–117)
BUN: 19 mg/dL (ref 6–23)
CO2: 30 mEq/L (ref 19–32)
Calcium: 9.8 mg/dL (ref 8.4–10.5)
Chloride: 101 mEq/L (ref 96–112)
Creatinine, Ser: 1.04 mg/dL (ref 0.40–1.50)
GFR: 83.87 mL/min (ref >60.00)
Glucose, Bld: 88 mg/dL (ref 70–99)
Potassium: 5 mEq/L (ref 3.5–5.1)
Sodium: 137 mEq/L (ref 135–145)
Total Bilirubin: 0.6 mg/dL (ref 0.2–1.2)
Total Protein: 7.3 g/dL (ref 6.0–8.3)

## 2022-03-31 LAB — CBC WITH DIFFERENTIAL/PLATELET
Basophils Absolute: 0 10*3/uL (ref 0.0–0.1)
Basophils Relative: 0.7 % (ref 0.0–3.0)
Eosinophils Absolute: 0.3 10*3/uL (ref 0.0–0.7)
Eosinophils Relative: 5.2 % — ABNORMAL HIGH (ref 0.0–5.0)
HCT: 43.1 % (ref 39.0–52.0)
Hemoglobin: 14.4 g/dL (ref 13.0–17.0)
Lymphocytes Relative: 30.6 % (ref 12.0–46.0)
Lymphs Abs: 1.9 10*3/uL (ref 0.7–4.0)
MCHC: 33.4 g/dL (ref 30.0–36.0)
MCV: 93.1 fl (ref 78.0–100.0)
Monocytes Absolute: 0.7 10*3/uL (ref 0.1–1.0)
Monocytes Relative: 11.9 % (ref 3.0–12.0)
Neutro Abs: 3.2 10*3/uL (ref 1.4–7.7)
Neutrophils Relative %: 51.6 % (ref 43.0–77.0)
Platelets: 238 10*3/uL (ref 150.0–400.0)
RBC: 4.63 Mil/uL (ref 4.22–5.81)
RDW: 12.7 % (ref 11.5–15.5)
WBC: 6.2 10*3/uL (ref 4.0–10.5)

## 2022-03-31 MED ORDER — TRAMADOL HCL 50 MG PO TABS: 50.0000 mg | ORAL_TABLET | Freq: Four times a day (QID) | ORAL | 0 refills | Status: DC | PRN

## 2022-03-31 NOTE — Patient Instructions (Addendum)
Pelvic region mass that could also be lymph node versus a lipoma.  Has been following this area and it has increased in size.  I did talk with the radiologist today who did recommend pelvic MRI with and without contrast.  Also previous radiologist who read the ultrasound recommended to do either CT or MRI area had unexpected growth.  Did CBC today and placed MRI of the pelvis today per radiologist recommendations.  Sent over order to referral coordinator.  Referral coordinator indicated that it was denied and indicated that they would not even review the note?.  Rather they would rely on conversation with myself.  So plan to call when I can and hopefully there will be prolonged wait time to talk to staff (as have patients scheduled back to back)making a decision on prior Auth.  Also considering referral directly to general surgeon to evaluate area.  They might want to do imaging study as well.  Follow-up date to be determined after lab and prior Auth determination review.

## 2022-03-31 NOTE — Progress Notes (Signed)
Subjective:    Patient ID: Christopher Andersen, male    DOB: August 17, 1971, 51 y.o.   MRN: [id number]  HPI  Pt has left lower quadrant lump/mass that appeared to get larger to pt just shortly after he got [place] on 02/17/2022. The [place] report showed below   IMPRESSION: 2.8 x 0.7 x 2.8 cm subcutaneous mass at the anterior left palpable lump. Characteristics suggest fatty tissue and in the absence of a visual abdominal wall defect is most likely a lipoma. Recommend CT or MR imaging if there is unexpected clinical growth.  Previously pt had mri of pelvis and not abnormality reported in that area. When deciding on [place] radiologist reviewed prior mri and did not see any obvious abnormality.  Pt has no pain in area. No fever, no chills or sweats.     Review of Systems  Constitutional:  Negative for chills, fatigue and fever.  Respiratory:  Negative for choking and shortness of breath.   Cardiovascular:  Negative for chest pain and palpitations.  Gastrointestinal:  Negative for abdominal pain, constipation, nausea and vomiting.  Musculoskeletal:  Negative for back pain.       Increase mass as described in HPI.  Skin:  Negative for rash.    Past Medical History:  Diagnosis Date   Anxiety    Depression    Hypertension      Social History   Socioeconomic History   Marital status: Divorced    Spouse name: Not on file   Number of children: 8   Years of education: Not on file   Highest education level: Not on file  Occupational History   Occupation: [employer]  Tobacco Use   Smoking status: Never   Smokeless tobacco: Never  Vaping Use   Vaping Use: Never used  Substance and Sexual Activity   Alcohol use: Yes    Comment: 3-5 beers daily.   Drug use: No   Sexual activity: Yes  Other Topics Concern   Not on file  Social History Narrative   Not on file   Social Determinants of Health   Financial Resource Strain: Not on file  Food Insecurity: Not on file   Transportation Needs: Not on file  Physical Activity: Not on file  Stress: Not on file  Social Connections: Not on file  Intimate Partner Violence: Not on file    Past Surgical History:  Procedure Laterality Date   LIPOMA EXCISION  2005   face   TONSILLECTOMY      Family History  Problem Relation Age of Onset   Dementia Mother    Hypertension Mother    Ulcers Mother    Diabetes Father    Non-Hodgkin's lymphoma Father    Colon cancer Neg Hx    Stomach cancer Neg Hx    Esophageal cancer Neg Hx     No Known Allergies  Current Outpatient Medications on File Prior to Visit  Medication Sig Dispense Refill   celecoxib (CELEBREX) 200 MG capsule Take 1 capsule (200 mg total) by mouth 2 (two) times daily between meals as needed. 60 capsule 3   cyclobenzaprine (FLEXERIL) 10 MG tablet TAKE 1 TABLET BY MOUTH EVERYDAY AT BEDTIME (Patient taking differently: Take 10 mg by mouth at bedtime as needed for muscle spasms.) 30 tablet 0   losartan (COZAAR) 50 MG tablet TAKE 1 TABLET BY MOUTH EVERY DAY 90 tablet 1   Multiple Vitamins-Minerals (HAIR/SKIN/NAILS/BIOTIN PO) Take 2 tablets by mouth daily.     traMADol (  ULTRAM) 50 MG tablet TAKE 1 TO 2 TABLETS BY MOUTH EVERY 6 HOURS AS NEEDED (Patient taking differently: Take 50 mg by mouth 2 (two) times daily.) 30 tablet 0   zinc gluconate 50 MG tablet Take 50 mg by mouth daily.     Current Facility-Administered Medications on File Prior to Visit  Medication Dose Route Frequency Provider Last Rate Last Admin   0.9 %  sodium chloride infusion  500 mL Intravenous Once [name], MD        BP 127/85   Pulse 75   Resp 18   Ht 6' (1.829 m)   Wt 202 lb (91.6 kg)   SpO2 100%   BMI 27.40 kg/m       Objective:   Physical Exam  General- No acute distress. Pleasant patient. Neck- Full range of motion, no jvd Lungs- Clear, even and unlabored. Heart- regular rate and rhythm. Neurologic- CNII- XII grossly intact.   Abdomen-soft,  nondistended, positive bowel sounds, no rebound or guarding.  No organomegaly.  On palpation just right of the anterior superior iliac crest there is a 2 to 3.0 cm oblong lump.  Feels like lipoma versus lymph node?.  Genital exam-on inspection and palpation. When patient coughs possible hernia at distal canal.       Assessment & Plan:   Patient Instructions  Pelvic region mass that could also be lymph node versus a lipoma.  Has been following this area and it has increased in size.  I did talk with the radiologist today who did recommend pelvic MRI with and without contrast.  Also previous radiologist who read the ultrasound recommended to do either CT or MRI area had unexpected growth.  Did CBC today and placed MRI of the pelvis today per radiologist recommendations.  Sent over order to referral coordinator.  Referral coordinator indicated that it was denied and indicated that they would not even review the note?.  Rather they would rely on conversation with myself.  So plan to call when I can and hopefully there will be prolonged wait time to talk to staff making a decision on prior Auth.  Also considering referral directly to general surgeon to evaluate area.  They might want to do imaging study as well.  Follow-up date to be determined after lab and prior Auth determination review.   Mackie Pai, PA-C    Time spent with patient today was  41 minutes which consisted of chart revdiew, discussing diagnosis, work up, potential treatment and documentation.  Time included discussing case with radiologist to get imaging recommendations.  Also now extra time will be taken in attempt to get a prior authorization for recommended imaging studies.

## 2022-03-31 NOTE — Telephone Encounter (Signed)
On Monday will you mind calling this number and getting authorizer on the phone. Then I will make case for mri. Get me when person is on the phone. That way will avoid waiting 30 minutes which often can happen. With back to back patients just don't have time to wait.   MRI was denied due to medical necessity, it will need to call them and provide additional information for the requestd. Faxing over the clinicals was not a option. Call [phone] Case # [id number]

## 2022-04-03 NOTE — Telephone Encounter (Signed)
Spoke to [place] and she stated the case is still open and needs peer to peer with provider/ordering physician

## 2022-04-05 ENCOUNTER — Other Ambulatory Visit: Payer: Self-pay | Admitting: Physician Assistant

## 2022-04-05 DIAGNOSIS — M87051 Idiopathic aseptic necrosis of right femur: Secondary | ICD-10-CM

## 2022-04-06 NOTE — Progress Notes (Signed)
Surgical Instructions    Your procedure is scheduled on Tuesday, June 20th, 2023.   Report to [facility] Main Entrance "A" at 09:50 A.M., then check in with the Admitting office.  Call this number if you have problems the morning of surgery:  [phone]   If you have any questions prior to your surgery date call [phone]: Open Monday-Friday 8am-4pm    Remember:  Do not eat after midnight the night before your surgery  You may drink clear liquids until 08:50 the morning of your surgery.   Clear liquids allowed are: Water, Non-Citrus Juices (without pulp), Carbonated Beverages, Clear Tea, Black Coffee ONLY (NO MILK, CREAM OR POWDERED CREAMER of any kind), and Gatorade  Patient Instructions  The night before surgery:  No food after midnight. ONLY clear liquids after midnight  The day of surgery (if you do NOT have diabetes):  Drink ONE (1) Pre-Surgery Clear Ensure by 08:50 the morning of surgery. Drink in one sitting. Do not sip.  This drink was given to you during your hospital  pre-op appointment visit.  Nothing else to drink after completing the  Pre-Surgery Clear Ensure.          If you have questions, please contact your surgeon's office.     Take these medicines the morning of surgery with A SIP OF WATER:   traMADol (ULTRAM) - as needed  As of today, STOP taking any Aspirin (unless otherwise instructed by your surgeon) Aleve, Naproxen, Ibuprofen, Motrin, Advil, Goody's, BC's, all herbal medications, fish oil, and all vitamins.    The day of surgery:          Do not wear jewelry  Do not wear lotions, powders, colognes, or deodorant. Men may shave face and neck. Do not bring valuables to the hospital.   [facility] is not responsible for any belongings or valuables. .   Do NOT Smoke (Tobacco/Vaping)  24 hours prior to your procedure  If you use a CPAP at night, you may bring your mask for your overnight stay.   Contacts, glasses, hearing aids, dentures or  partials may not be worn into surgery, please bring cases for these belongings   For patients admitted to the hospital, discharge time will be determined by your treatment team.   Patients discharged the day of surgery will not be allowed to drive home, and someone needs to stay with them for 24 hours.   SURGICAL WAITING ROOM VISITATION Patients having surgery or a procedure in a hospital may have two support people. Children under the age of 43 must have an adult with them who is not the patient. They may stay in the waiting area during the procedure and may switch out with other visitors. If the patient needs to stay at the hospital during part of their recovery, the visitor guidelines for inpatient rooms apply.  Please refer to the [facility] website for the visitor guidelines for Inpatients (after your surgery is over and you are in a regular room).    Special instructions:    Oral Hygiene is also important to reduce your risk of infection.  Remember - BRUSH YOUR TEETH THE MORNING OF SURGERY WITH YOUR REGULAR TOOTHPASTE   [facility]- Preparing For Surgery  Before surgery, you can play an important role. Because skin is not sterile, your skin needs to be as free of germs as possible. You can reduce the number of germs on your skin by washing with CHG (chlorahexidine gluconate) Soap before surgery.  CHG is an antiseptic cleaner which kills germs and bonds with the skin to continue killing germs even after washing.     Please do not use if you have an allergy to CHG or antibacterial soaps. If your skin becomes reddened/irritated stop using the CHG.  Do not shave (including legs and underarms) for at least 48 hours prior to first CHG shower. It is OK to shave your face.  Please follow these instructions carefully.     Shower the NIGHT BEFORE SURGERY and the MORNING OF SURGERY with CHG Soap.   If you chose to wash your hair, wash your hair first as usual with your normal shampoo.  After you shampoo, rinse your hair and body thoroughly to remove the shampoo.  Then [employer] and genitals (private parts) with your normal soap and rinse thoroughly to remove soap.  After that Use CHG Soap as you would any other liquid soap. You can apply CHG directly to the skin and wash gently with a scrungie or a clean washcloth.   Apply the CHG Soap to your body ONLY FROM THE NECK DOWN.  Do not use on open wounds or open sores. Avoid contact with your eyes, ears, mouth and genitals (private parts). Wash Face and genitals (private parts)  with your normal soap.   Wash thoroughly, paying special attention to the area where your surgery will be performed.  Thoroughly rinse your body with warm water from the neck down.  DO NOT shower/wash with your normal soap after using and rinsing off the CHG Soap.  Pat yourself dry with a CLEAN TOWEL.  Wear CLEAN PAJAMAS to bed the night before surgery  Place CLEAN SHEETS on your bed the night before your surgery  DO NOT SLEEP WITH PETS.   Day of Surgery:  Take a shower with CHG soap. Wear Clean/Comfortable clothing the morning of surgery Do not apply any deodorants/lotions.   Remember to brush your teeth WITH YOUR REGULAR TOOTHPASTE.    If you received a COVID test during your pre-op visit, it is requested that you wear a mask when out in public, stay away from anyone that may not be feeling well, and notify your surgeon if you develop symptoms. If you have been in contact with anyone that has tested positive in the last 10 days, please notify your surgeon.    Please read over the following fact sheets that you were given.

## 2022-04-07 ENCOUNTER — Encounter (HOSPITAL_COMMUNITY)
Admission: RE | Admit: 2022-04-07 | Discharge: 2022-04-07 | Disposition: A | Discharge status: Home / Self Care | Payer: BC Managed Care – PPO | Source: Ambulatory Visit | Attending: Orthopaedic Surgery | Admitting: Orthopaedic Surgery

## 2022-04-07 ENCOUNTER — Other Ambulatory Visit: Payer: Self-pay

## 2022-04-07 ENCOUNTER — Encounter (HOSPITAL_COMMUNITY): Payer: Self-pay

## 2022-04-07 VITALS — BP 140/90 | HR 72 | Temp 98.6°F | Resp 17 | Ht 72.0 in | Wt 201.5 lb

## 2022-04-07 DIAGNOSIS — M87051 Idiopathic aseptic necrosis of right femur: Secondary | ICD-10-CM | POA: Insufficient documentation

## 2022-04-07 DIAGNOSIS — I2510 Atherosclerotic heart disease of native coronary artery without angina pectoris: Secondary | ICD-10-CM | POA: Diagnosis not present

## 2022-04-07 DIAGNOSIS — Z01818 Encounter for other preprocedural examination: Secondary | ICD-10-CM | POA: Diagnosis not present

## 2022-04-07 HISTORY — DX: Unspecified osteoarthritis, unspecified site: M19.90

## 2022-04-07 HISTORY — DX: Benign lipomatous neoplasm, unspecified: D17.9

## 2022-04-07 HISTORY — DX: Gastro-esophageal reflux disease without esophagitis: K21.9

## 2022-04-07 HISTORY — DX: Pneumonia, unspecified organism: J18.9

## 2022-04-07 HISTORY — DX: Unspecified asthma, uncomplicated: J45.909

## 2022-04-07 LAB — TYPE AND SCREEN
ABO/RH(D): O POS
Antibody Screen: NEGATIVE

## 2022-04-07 LAB — SURGICAL PCR SCREEN
MRSA, PCR: NEGATIVE
Staphylococcus aureus: NEGATIVE

## 2022-04-07 NOTE — Progress Notes (Signed)
Surgical Instructions    Your procedure is scheduled on Tuesday, June 20th, 2023.   Report to [facility] Main Entrance "A" at 09:50 A.M., then check in with the Admitting office.  Call this number if you have problems the morning of surgery:  [phone]   If you have any questions prior to your surgery date call [phone]: Open Monday-Friday 8am-4pm    Remember:  Do not eat after midnight the night before your surgery  You may drink clear liquids until 08:50 the morning of your surgery.   Clear liquids allowed are: Water, Non-Citrus Juices (without pulp), Carbonated Beverages, Clear Tea, Black Coffee ONLY (NO MILK, CREAM OR POWDERED CREAMER of any kind), and Gatorade  Patient Instructions  The night before surgery:  No food after midnight. ONLY clear liquids after midnight  The day of surgery (if you do NOT have diabetes):  Drink ONE (1) Pre-Surgery Clear Ensure by 08:50 the morning of surgery. Drink in one sitting. Do not sip.  This drink was given to you during your hospital  pre-op appointment visit.  Nothing else to drink after completing the  Pre-Surgery Clear Ensure.          If you have questions, please contact your surgeon's office.     Take these medicines the morning of surgery with A SIP OF WATER:   traMADol (ULTRAM) - as needed  As of today, STOP taking any Aspirin (unless otherwise instructed by your surgeon) Aleve, Naproxen, Ibuprofen, Motrin, Advil, Goody's, BC's, all herbal medications, fish oil, and all vitamins. This includes celecoxib (CELEBREX).    The day of surgery:          Do not wear jewelry  Do not wear lotions, powders, colognes, or deodorant. Men may shave face and neck. Do not bring valuables to the hospital.   [facility] is not responsible for any belongings or valuables. .   Do NOT Smoke (Tobacco/Vaping)  24 hours prior to your procedure  If you use a CPAP at night, you may bring your mask for your overnight stay.    Contacts, glasses, hearing aids, dentures or partials may not be worn into surgery, please bring cases for these belongings   For patients admitted to the hospital, discharge time will be determined by your treatment team.   Patients discharged the day of surgery will not be allowed to drive home, and someone needs to stay with them for 24 hours.   SURGICAL WAITING ROOM VISITATION Patients having surgery or a procedure in a hospital may have two support people. Children under the age of 36 must have an adult with them who is not the patient. They may stay in the waiting area during the procedure and may switch out with other visitors. If the patient needs to stay at the hospital during part of their recovery, the visitor guidelines for inpatient rooms apply.  Please refer to the [facility] website for the visitor guidelines for Inpatients (after your surgery is over and you are in a regular room).    Special instructions:    Oral Hygiene is also important to reduce your risk of infection.  Remember - BRUSH YOUR TEETH THE MORNING OF SURGERY WITH YOUR REGULAR TOOTHPASTE   [place]- Preparing For Surgery  Before surgery, you can play an important role. Because skin is not sterile, your skin needs to be as free of germs as possible. You can reduce the number of germs on your skin by washing with CHG (chlorahexidine gluconate)  Soap before surgery.  CHG is an antiseptic cleaner which kills germs and bonds with the skin to continue killing germs even after washing.     Please do not use if you have an allergy to CHG or antibacterial soaps. If your skin becomes reddened/irritated stop using the CHG.  Do not shave (including legs and underarms) for at least 48 hours prior to first CHG shower. It is OK to shave your face.  Please follow these instructions carefully.     Shower the NIGHT BEFORE SURGERY and the MORNING OF SURGERY with CHG Soap.   If you chose to wash your hair, wash your  hair first as usual with your normal shampoo. After you shampoo, rinse your hair and body thoroughly to remove the shampoo.  Then [employer] and genitals (private parts) with your normal soap and rinse thoroughly to remove soap.  After that Use CHG Soap as you would any other liquid soap. You can apply CHG directly to the skin and wash gently with a scrungie or a clean washcloth.   Apply the CHG Soap to your body ONLY FROM THE NECK DOWN.  Do not use on open wounds or open sores. Avoid contact with your eyes, ears, mouth and genitals (private parts). Wash Face and genitals (private parts)  with your normal soap.   Wash thoroughly, paying special attention to the area where your surgery will be performed.  Thoroughly rinse your body with warm water from the neck down.  DO NOT shower/wash with your normal soap after using and rinsing off the CHG Soap.  Pat yourself dry with a CLEAN TOWEL.  Wear CLEAN PAJAMAS to bed the night before surgery  Place CLEAN SHEETS on your bed the night before your surgery  DO NOT SLEEP WITH PETS.   Day of Surgery:  Take a shower with CHG soap. Wear Clean/Comfortable clothing the morning of surgery Do not apply any deodorants/lotions.   Remember to brush your teeth WITH YOUR REGULAR TOOTHPASTE.    If you received a COVID test during your pre-op visit, it is requested that you wear a mask when out in public, stay away from anyone that may not be feeling well, and notify your surgeon if you develop symptoms. If you have been in contact with anyone that has tested positive in the last 10 days, please notify your surgeon.    Please read over the following fact sheets that you were given.

## 2022-04-07 NOTE — Progress Notes (Signed)
PCP - Dr. Mackie Pai PA Cardiologist - denies  PPM/ICD - n/a Device Orders - n/a  Rep Notified - n/a  Chest x-ray - n/a EKG - 04/07/2022 Stress Test - denies ECHO - denies Cardiac Cath - denies  Sleep Study - denies CPAP - n/a  No DM  Blood Thinner Instructions: n/a  Aspirin Instructions: STOP taking any Aspirin (unless otherwise instructed by your surgeon) Aleve, Naproxen, Ibuprofen, Motrin, Advil, Goody's, BC's, all herbal medications, fish oil, and all vitamins. This includes celecoxib (CELEBREX).  ERAS Protcol - Yes. You may drink clear liquids until 08:50 the morning of your surgery PRE-SURGERY Ensure or G2- Ensure  COVID TEST- n/a   Anesthesia review: No  Patient denies shortness of breath, fever, cough and chest pain at PAT appointment   All instructions explained to the patient, with a verbal understanding of the material. Patient agrees to go over the instructions while at home for a better understanding. Patient also instructed to self quarantine after being tested for COVID-19. The opportunity to ask questions was provided.

## 2022-04-13 ENCOUNTER — Encounter (HOSPITAL_COMMUNITY): Payer: Self-pay | Admitting: Radiology

## 2022-04-13 ENCOUNTER — Ambulatory Visit (HOSPITAL_COMMUNITY): Admission: RE | Admit: 2022-04-13 | Payer: BC Managed Care – PPO | Source: Ambulatory Visit

## 2022-04-13 ENCOUNTER — Other Ambulatory Visit: Payer: Self-pay | Admitting: Orthopaedic Surgery

## 2022-04-14 ENCOUNTER — Encounter: Payer: Self-pay | Admitting: Orthopaedic Surgery

## 2022-04-18 ENCOUNTER — Encounter (HOSPITAL_COMMUNITY)
Admission: RE | Disposition: A | Discharge status: Home Health | Payer: Self-pay | Source: Home / Self Care | Attending: Orthopaedic Surgery

## 2022-04-18 ENCOUNTER — Ambulatory Visit (HOSPITAL_COMMUNITY): Payer: BC Managed Care – PPO | Admitting: Certified Registered"

## 2022-04-18 ENCOUNTER — Other Ambulatory Visit: Payer: Self-pay

## 2022-04-18 ENCOUNTER — Encounter (HOSPITAL_COMMUNITY): Payer: Self-pay | Admitting: Orthopaedic Surgery

## 2022-04-18 ENCOUNTER — Observation Stay (HOSPITAL_COMMUNITY)
Admission: RE | Admit: 2022-04-18 | Discharge: 2022-04-19 | Disposition: A | Discharge status: Home Health | Payer: BC Managed Care – PPO | Attending: Orthopaedic Surgery | Admitting: Orthopaedic Surgery

## 2022-04-18 ENCOUNTER — Observation Stay (HOSPITAL_COMMUNITY): Payer: BC Managed Care – PPO

## 2022-04-18 ENCOUNTER — Ambulatory Visit (HOSPITAL_COMMUNITY): Payer: BC Managed Care – PPO

## 2022-04-18 DIAGNOSIS — M87051 Idiopathic aseptic necrosis of right femur: Principal | ICD-10-CM

## 2022-04-18 DIAGNOSIS — Z96641 Presence of right artificial hip joint: Secondary | ICD-10-CM | POA: Diagnosis not present

## 2022-04-18 DIAGNOSIS — M87851 Other osteonecrosis, right femur: Secondary | ICD-10-CM | POA: Diagnosis not present

## 2022-04-18 DIAGNOSIS — J45909 Unspecified asthma, uncomplicated: Secondary | ICD-10-CM | POA: Diagnosis not present

## 2022-04-18 DIAGNOSIS — Z87891 Personal history of nicotine dependence: Secondary | ICD-10-CM | POA: Insufficient documentation

## 2022-04-18 DIAGNOSIS — I10 Essential (primary) hypertension: Secondary | ICD-10-CM | POA: Diagnosis not present

## 2022-04-18 DIAGNOSIS — Z471 Aftercare following joint replacement surgery: Secondary | ICD-10-CM | POA: Diagnosis not present

## 2022-04-18 DIAGNOSIS — Z79899 Other long term (current) drug therapy: Secondary | ICD-10-CM | POA: Insufficient documentation

## 2022-04-18 HISTORY — PX: TOTAL HIP ARTHROPLASTY: SHX124

## 2022-04-18 LAB — ABO/RH: ABO/RH(D): O POS

## 2022-04-18 SURGERY — ARTHROPLASTY, HIP, TOTAL, ANTERIOR APPROACH
Anesthesia: Spinal | Site: Hip | Laterality: Right

## 2022-04-18 MED ORDER — PROPOFOL 500 MG/50ML IV EMUL
INTRAVENOUS | Status: DC | PRN
  Administered 2022-04-18: 150 ug/kg/min via INTRAVENOUS

## 2022-04-18 MED ORDER — POVIDONE-IODINE 10 % EX SWAB: 2.0000 "application " | Freq: Once | CUTANEOUS | Status: DC

## 2022-04-18 MED ORDER — PANTOPRAZOLE SODIUM 40 MG PO TBEC
40.0000 mg | DELAYED_RELEASE_TABLET | Freq: Every day | ORAL | Status: DC
  Administered 2022-04-19: 40 mg via ORAL
  Filled 2022-04-18: qty 1

## 2022-04-18 MED ORDER — CEFAZOLIN SODIUM-DEXTROSE 2-4 GM/100ML-% IV SOLN
2.0000 g | INTRAVENOUS | Status: AC
  Administered 2022-04-18: 2 g via INTRAVENOUS
  Filled 2022-04-18: qty 100

## 2022-04-18 MED ORDER — METHOCARBAMOL 500 MG PO TABS
500.0000 mg | ORAL_TABLET | Freq: Four times a day (QID) | ORAL | Status: DC | PRN
  Administered 2022-04-19: 500 mg via ORAL
  Filled 2022-04-18 (×2): qty 1

## 2022-04-18 MED ORDER — KETOROLAC TROMETHAMINE 15 MG/ML IJ SOLN
7.5000 mg | Freq: Four times a day (QID) | INTRAMUSCULAR | Status: AC
  Administered 2022-04-18 – 2022-04-19 (×4): 7.5 mg via INTRAVENOUS
  Filled 2022-04-18 (×4): qty 1

## 2022-04-18 MED ORDER — HYDROMORPHONE HCL 1 MG/ML IJ SOLN
0.5000 mg | INTRAMUSCULAR | Status: DC | PRN
  Administered 2022-04-19 (×2): 1 mg via INTRAVENOUS
  Filled 2022-04-18 (×3): qty 1

## 2022-04-18 MED ORDER — CEFAZOLIN SODIUM-DEXTROSE 1-4 GM/50ML-% IV SOLN
1.0000 g | Freq: Four times a day (QID) | INTRAVENOUS | Status: AC
  Administered 2022-04-18 – 2022-04-19 (×2): 1 g via INTRAVENOUS
  Filled 2022-04-18 (×2): qty 50

## 2022-04-18 MED ORDER — MIDAZOLAM HCL 2 MG/2ML IJ SOLN
INTRAMUSCULAR | Status: DC | PRN
  Administered 2022-04-18: 2 mg via INTRAVENOUS

## 2022-04-18 MED ORDER — PHENYLEPHRINE HCL-NACL 20-0.9 MG/250ML-% IV SOLN
INTRAVENOUS | Status: DC | PRN
  Administered 2022-04-18: 25 ug/min via INTRAVENOUS

## 2022-04-18 MED ORDER — FENTANYL CITRATE (PF) 250 MCG/5ML IJ SOLN
INTRAMUSCULAR | Status: AC
  Filled 2022-04-18: qty 5

## 2022-04-18 MED ORDER — METOCLOPRAMIDE HCL 5 MG/ML IJ SOLN: 5.0000 mg | Freq: Three times a day (TID) | INTRAMUSCULAR | Status: DC | PRN

## 2022-04-18 MED ORDER — METHOCARBAMOL 1000 MG/10ML IJ SOLN
500.0000 mg | Freq: Four times a day (QID) | INTRAVENOUS | Status: DC | PRN
  Filled 2022-04-18: qty 5

## 2022-04-18 MED ORDER — PROPOFOL 10 MG/ML IV BOLUS
INTRAVENOUS | Status: AC
  Filled 2022-04-18: qty 20

## 2022-04-18 MED ORDER — ACETAMINOPHEN 325 MG PO TABS: 325.0000 mg | ORAL_TABLET | Freq: Four times a day (QID) | ORAL | Status: DC | PRN

## 2022-04-18 MED ORDER — MENTHOL 3 MG MT LOZG: 1.0000 | LOZENGE | OROMUCOSAL | Status: DC | PRN

## 2022-04-18 MED ORDER — OXYCODONE HCL 5 MG PO TABS: 5.0000 mg | ORAL_TABLET | Freq: Once | ORAL | Status: DC | PRN

## 2022-04-18 MED ORDER — TRANEXAMIC ACID-NACL 1000-0.7 MG/100ML-% IV SOLN
1000.0000 mg | INTRAVENOUS | Status: AC
  Administered 2022-04-18: 1000 mg via INTRAVENOUS
  Filled 2022-04-18: qty 100

## 2022-04-18 MED ORDER — BUPIVACAINE IN DEXTROSE 0.75-8.25 % IT SOLN
INTRATHECAL | Status: DC | PRN
  Administered 2022-04-18: 2 mL via INTRATHECAL

## 2022-04-18 MED ORDER — ORAL CARE MOUTH RINSE: 15.0000 mL | Freq: Once | OROMUCOSAL | Status: AC

## 2022-04-18 MED ORDER — HYDROMORPHONE HCL 1 MG/ML IJ SOLN
0.2500 mg | INTRAMUSCULAR | Status: DC | PRN
  Administered 2022-04-18 (×4): 0.5 mg via INTRAVENOUS

## 2022-04-18 MED ORDER — POVIDONE-IODINE 10 % EX SWAB
2.0000 | Freq: Once | CUTANEOUS | Status: AC
  Administered 2022-04-18: 2 via TOPICAL

## 2022-04-18 MED ORDER — DOCUSATE SODIUM 100 MG PO CAPS
100.0000 mg | ORAL_CAPSULE | Freq: Two times a day (BID) | ORAL | Status: DC
  Administered 2022-04-18 – 2022-04-19 (×2): 100 mg via ORAL
  Filled 2022-04-18 (×2): qty 1

## 2022-04-18 MED ORDER — HYDROMORPHONE HCL 1 MG/ML IJ SOLN
INTRAMUSCULAR | Status: AC
  Filled 2022-04-18: qty 1

## 2022-04-18 MED ORDER — ASPIRIN 81 MG PO CHEW
81.0000 mg | CHEWABLE_TABLET | Freq: Two times a day (BID) | ORAL | Status: DC
Start: 2022-04-18 — End: 2022-04-19
  Administered 2022-04-18 – 2022-04-19 (×2): 81 mg via ORAL
  Filled 2022-04-18 (×2): qty 1

## 2022-04-18 MED ORDER — POLYETHYLENE GLYCOL 3350 17 G PO PACK: 17.0000 g | PACK | Freq: Every day | ORAL | Status: DC | PRN

## 2022-04-18 MED ORDER — METOCLOPRAMIDE HCL 5 MG PO TABS: 5.0000 mg | ORAL_TABLET | Freq: Three times a day (TID) | ORAL | Status: DC | PRN

## 2022-04-18 MED ORDER — LACTATED RINGERS IV SOLN: INTRAVENOUS | Status: DC

## 2022-04-18 MED ORDER — SODIUM CHLORIDE 0.9 % IV SOLN: INTRAVENOUS | Status: DC

## 2022-04-18 MED ORDER — PROPOFOL 10 MG/ML IV BOLUS
INTRAVENOUS | Status: DC | PRN
  Administered 2022-04-18: 20 mg via INTRAVENOUS
  Administered 2022-04-18: 40 mg via INTRAVENOUS
  Administered 2022-04-18: 30 mg via INTRAVENOUS
  Administered 2022-04-18: 20 mg via INTRAVENOUS

## 2022-04-18 MED ORDER — ONDANSETRON HCL 4 MG/2ML IJ SOLN: 4.0000 mg | Freq: Four times a day (QID) | INTRAMUSCULAR | Status: DC | PRN

## 2022-04-18 MED ORDER — ALUM & MAG HYDROXIDE-SIMETH 200-200-20 MG/5ML PO SUSP: 30.0000 mL | ORAL | Status: DC | PRN

## 2022-04-18 MED ORDER — SODIUM CHLORIDE 0.9 % IR SOLN
Status: DC | PRN
  Administered 2022-04-18: 1000 mL

## 2022-04-18 MED ORDER — ONDANSETRON HCL 4 MG/2ML IJ SOLN
4.0000 mg | Freq: Once | INTRAMUSCULAR | Status: DC | PRN
Start: 2022-04-18 — End: 2022-04-18

## 2022-04-18 MED ORDER — DIPHENHYDRAMINE HCL 12.5 MG/5ML PO ELIX: 12.5000 mg | ORAL_SOLUTION | ORAL | Status: DC | PRN

## 2022-04-18 MED ORDER — OXYCODONE HCL 5 MG PO TABS
10.0000 mg | ORAL_TABLET | ORAL | Status: DC | PRN
  Administered 2022-04-18: 10 mg via ORAL
  Filled 2022-04-18: qty 2

## 2022-04-18 MED ORDER — MIDAZOLAM HCL 2 MG/2ML IJ SOLN
INTRAMUSCULAR | Status: AC
Start: 2022-04-18 — End: ?
  Filled 2022-04-18: qty 2

## 2022-04-18 MED ORDER — HYDROMORPHONE HCL 1 MG/ML IJ SOLN
INTRAMUSCULAR | Status: AC
  Administered 2022-04-18: 1 mg
  Filled 2022-04-18: qty 1

## 2022-04-18 MED ORDER — ONDANSETRON HCL 4 MG PO TABS: 4.0000 mg | ORAL_TABLET | Freq: Four times a day (QID) | ORAL | Status: DC | PRN

## 2022-04-18 MED ORDER — OXYCODONE HCL 5 MG/5ML PO SOLN: 5.0000 mg | Freq: Once | ORAL | Status: DC | PRN

## 2022-04-18 MED ORDER — PHENOL 1.4 % MT LIQD: 1.0000 | OROMUCOSAL | Status: DC | PRN

## 2022-04-18 MED ORDER — OXYCODONE HCL 5 MG PO TABS
5.0000 mg | ORAL_TABLET | ORAL | Status: DC | PRN
  Administered 2022-04-18 – 2022-04-19 (×2): 10 mg via ORAL
  Filled 2022-04-18 (×2): qty 2

## 2022-04-18 MED ORDER — 0.9 % SODIUM CHLORIDE (POUR BTL) OPTIME
TOPICAL | Status: DC | PRN
  Administered 2022-04-18: 1000 mL

## 2022-04-18 MED ORDER — CHLORHEXIDINE GLUCONATE 0.12 % MT SOLN
15.0000 mL | Freq: Once | OROMUCOSAL | Status: AC
  Administered 2022-04-18: 15 mL via OROMUCOSAL
  Filled 2022-04-18: qty 15

## 2022-04-18 SURGICAL SUPPLY — 51 items
ACETAB CUP W/GRIPTION 54 (Plate) ×2 IMPLANT
BAG COUNTER SPONGE SURGICOUNT (BAG) ×2 IMPLANT
BLADE CLIPPER SURG (BLADE) IMPLANT
BLADE SAW SGTL 18X1.27X75 (BLADE) ×2 IMPLANT
COVER SURGICAL LIGHT HANDLE (MISCELLANEOUS) ×2 IMPLANT
CUP ACETAB W/GRIPTION 54 (Plate) IMPLANT
DRAPE C-ARM 42X72 X-RAY (DRAPES) ×2 IMPLANT
DRAPE STERI IOBAN 125X83 (DRAPES) ×2 IMPLANT
DRAPE U-SHAPE 47X51 STRL (DRAPES) ×5 IMPLANT
DRSG AQUACEL AG ADV 3.5X10 (GAUZE/BANDAGES/DRESSINGS) ×1 IMPLANT
DURAPREP 26ML APPLICATOR (WOUND CARE) ×2 IMPLANT
ELECT BLADE 4.0 EZ CLEAN MEGAD (MISCELLANEOUS) ×2
ELECT BLADE 6.5 EXT (BLADE) IMPLANT
ELECT REM PT RETURN 9FT ADLT (ELECTROSURGICAL) ×2
ELECTRODE BLDE 4.0 EZ CLN MEGD (MISCELLANEOUS) ×1 IMPLANT
ELECTRODE REM PT RTRN 9FT ADLT (ELECTROSURGICAL) ×1 IMPLANT
FACESHIELD WRAPAROUND (MASK) ×4 IMPLANT
FACESHIELD WRAPAROUND OR TEAM (MASK) ×2 IMPLANT
GLOVE BIOGEL PI IND STRL 8 (GLOVE) ×2 IMPLANT
GLOVE BIOGEL PI INDICATOR 8 (GLOVE) ×2
GLOVE ECLIPSE 8.0 STRL XLNG CF (GLOVE) ×2 IMPLANT
GLOVE ORTHO TXT STRL SZ7.5 (GLOVE) ×4 IMPLANT
GOWN STRL REUS W/ TWL LRG LVL3 (GOWN DISPOSABLE) ×2 IMPLANT
GOWN STRL REUS W/ TWL XL LVL3 (GOWN DISPOSABLE) ×2 IMPLANT
GOWN STRL REUS W/TWL LRG LVL3 (GOWN DISPOSABLE) ×4
GOWN STRL REUS W/TWL XL LVL3 (GOWN DISPOSABLE) ×4
HANDPIECE INTERPULSE COAX TIP (DISPOSABLE) ×2
HEAD CERAMIC 36 PLUS5 (Hips) ×1 IMPLANT
KIT BASIN OR (CUSTOM PROCEDURE TRAY) ×2 IMPLANT
KIT TURNOVER KIT B (KITS) ×2 IMPLANT
LINER NEUTRAL 54X36MM PLUS 4 (Hips) ×1 IMPLANT
MANIFOLD NEPTUNE II (INSTRUMENTS) ×2 IMPLANT
NS IRRIG 1000ML POUR BTL (IV SOLUTION) ×2 IMPLANT
PACK TOTAL JOINT (CUSTOM PROCEDURE TRAY) ×2 IMPLANT
PAD ARMBOARD 7.5X6 YLW CONV (MISCELLANEOUS) ×2 IMPLANT
SET HNDPC FAN SPRY TIP SCT (DISPOSABLE) ×1 IMPLANT
STAPLER VISISTAT 35W (STAPLE) IMPLANT
STEM FEMORAL SZ5 HIGH ACTIS (Stem) ×1 IMPLANT
SUT ETHIBOND NAB CT1 #1 30IN (SUTURE) ×2 IMPLANT
SUT MNCRL AB 4-0 PS2 18 (SUTURE) IMPLANT
SUT VIC AB 0 CT1 27 (SUTURE) ×2
SUT VIC AB 0 CT1 27XBRD ANBCTR (SUTURE) ×1 IMPLANT
SUT VIC AB 1 CT1 27 (SUTURE) ×2
SUT VIC AB 1 CT1 27XBRD ANBCTR (SUTURE) ×1 IMPLANT
SUT VIC AB 2-0 CT1 27 (SUTURE) ×2
SUT VIC AB 2-0 CT1 TAPERPNT 27 (SUTURE) ×1 IMPLANT
TOWEL GREEN STERILE (TOWEL DISPOSABLE) ×2 IMPLANT
TOWEL GREEN STERILE FF (TOWEL DISPOSABLE) ×2 IMPLANT
TRAY FOLEY W/BAG SLVR 16FR (SET/KITS/TRAYS/PACK) ×2
TRAY FOLEY W/BAG SLVR 16FR ST (SET/KITS/TRAYS/PACK) IMPLANT
WATER STERILE IRR 1000ML POUR (IV SOLUTION) ×4 IMPLANT

## 2022-04-18 NOTE — Anesthesia Procedure Notes (Signed)
Procedure Name: MAC Date/Time: 04/18/2022 12:09 PM  Performed by: Barrington Ellison, CRNAPre-anesthesia Checklist: Patient identified, Emergency Drugs available, Suction available, Patient being monitored and Timeout performed Patient Re-evaluated:Patient Re-evaluated prior to induction Oxygen Delivery Method: Simple face mask

## 2022-04-18 NOTE — Brief Op Note (Signed)
04/18/2022  1:22 PM  PATIENT:  Elenore Paddy  51 y.o. male  PRE-OPERATIVE DIAGNOSIS:  AVASCULAR NECROSIS RIGHT HIP  POST-OPERATIVE DIAGNOSIS:  AVASCULAR NECROSIS RIGHT HIP  PROCEDURE:  Procedure(s): RIGHT TOTAL HIP ARTHROPLASTY ANTERIOR APPROACH (Right)  SURGEON:  Surgeon(s) and Role:    * Mcarthur Rossetti, MD - Primary  PHYSICIAN ASSISTANT:  Benita Stabile, PA-C  ANESTHESIA:   spinal  EBL:  250 mL   COUNTS:  YES  DICTATION: .Other Dictation: Dictation Number [id number]  PLAN OF CARE: Admit for overnight observation  PATIENT DISPOSITION:  PACU - hemodynamically stable.   Delay start of Pharmacological VTE agent (>24hrs) due to surgical blood loss or risk of bleeding: no

## 2022-04-18 NOTE — Evaluation (Signed)
Physical Therapy Evaluation Patient Details Name: Christopher Andersen MRN: [id number] DOB: 1971-06-15 Today's Date: 04/18/2022  History of Present Illness  pt is a 51 y/o male admitted 6/20 for elective R THA due to avascular necrosis and 10/10 pain.  PMHx: HTN, anxiety,  Clinical Impression  Pt admitted with/for elective THA.  Pt at min guard to mod I level..  Pt currently limited functionally due to the problems listed below.  (see problems list.)  Pt will benefit from PT to maximize function and safety to be able to get home safely with available assist.      Recommendations for follow up therapy are one component of a multi-disciplinary discharge planning process, led by the attending physician.  Recommendations may be updated based on patient status, additional functional criteria and insurance authorization.  Follow Up Recommendations Follow physician's recommendations for discharge plan and follow up therapies      Assistance Recommended at Discharge PRN  Patient can return home with the following  A lot of help with bathing/dressing/bathroom;Assistance with feeding;Assist for transportation;Help with stairs or ramp for entrance    Equipment Recommendations Rolling walker (2 wheels)  Recommendations for Other Services       Functional Status Assessment Patient has had a recent decline in their functional status and demonstrates the ability to make significant improvements in function in a reasonable and predictable amount of time.     Precautions / Restrictions Precautions Precautions: Anterior Hip Precaution Booklet Issued:  (surgical hip exercises given and addressed with pt/wife.)      Mobility  Bed Mobility Overal bed mobility: Needs Assistance Bed Mobility: Supine to Sit     Supine to sit: Supervision     General bed mobility comments: bridged to EOB and came straight up as bil LE's lower to floor.    Transfers Overall transfer level: Needs assistance    Transfers: Sit to/from Stand Sit to Stand: Min guard           General transfer comment: cues for hant placement for safety    Ambulation/Gait Ambulation/Gait assistance: Supervision Gait Distance (Feet): 120 Feet Assistive device: Rolling walker (2 wheels) Gait Pattern/deviations: Step-through pattern   Gait velocity interpretation: <1.8 ft/sec, indicate of risk for recurrent falls   General Gait Details: worked on improving heel/toe pattern, generally steady, great use of the RW.  moderate independent level.  Stairs            Wheelchair Mobility    Modified Rankin (Stroke Patients Only)       Balance Overall balance assessment: No apparent balance deficits (not formally assessed)                                           Pertinent Vitals/Pain Pain Assessment Pain Assessment: 0-10 Pain Score: 7  Pain Location: R hip surgical site Pain Descriptors / Indicators: Grimacing, Guarding, Sore Pain Intervention(s): Monitored during session    Home Living Family/patient expects to be discharged to:: Private residence Living Arrangements: Spouse/significant other;Children Available Help at Discharge: Family;Available 24 hours/day Type of Home: House Home Access: Stairs to enter Entrance Stairs-Rails: Right;Left   Alternate Level Stairs-Number of Steps: flight Home Layout: Two level Home Equipment: Crutches      Prior Function Prior Level of Function : Independent/Modified Independent;Working/employed;Driving  Hand Dominance        Extremity/Trunk Assessment   Upper Extremity Assessment Upper Extremity Assessment: Overall WFL for tasks assessed    Lower Extremity Assessment Lower Extremity Assessment: Overall WFL for tasks assessed;RLE deficits/detail RLE Deficits / Details: weak from pain, RLE Sensation: decreased light touch    Cervical / Trunk Assessment Cervical / Trunk Assessment: Normal   Communication   Communication: No difficulties  Cognition Arousal/Alertness: Awake/alert Behavior During Therapy: WFL for tasks assessed/performed Overall Cognitive Status: Within Functional Limits for tasks assessed                                          General Comments      Exercises     Assessment/Plan    PT Assessment Patient needs continued PT services  PT Problem List Decreased strength;Decreased activity tolerance;Decreased mobility;Decreased knowledge of use of DME;Pain       PT Treatment Interventions Gait training;DME instruction;Stair training;Functional mobility training;Therapeutic activities;Patient/family education;Therapeutic exercise    PT Goals (Current goals can be found in the Care Plan section)  Acute Rehab PT Goals Patient Stated Goal: ready for next surgery, back to work PT Goal Formulation: With patient Time For Goal Achievement: 04/21/22 Potential to Achieve Goals: Good    Frequency 7X/week     Co-evaluation               AM-PAC PT "6 Clicks" Mobility  Outcome Measure Help needed turning from your back to your side while in a flat bed without using bedrails?: A Little Help needed moving from lying on your back to sitting on the side of a flat bed without using bedrails?: A Little Help needed moving to and from a bed to a chair (including a wheelchair)?: A Little Help needed standing up from a chair using your arms (e.g., wheelchair or bedside chair)?: A Little Help needed to walk in hospital room?: A Little Help needed climbing 3-5 steps with a railing? : A Little 6 Click Score: 18    End of Session   Activity Tolerance: Patient tolerated treatment well Patient left: in chair;with call bell/phone within reach Nurse Communication: Mobility status PT Visit Diagnosis: Other abnormalities of gait and mobility (R26.89);Pain Pain - Right/Left: Right Pain - part of body: Hip    Time: 0092-3300 PT Time Calculation  (min) (ACUTE ONLY): 28 min   Charges:   PT Evaluation $PT Eval Low Complexity: 1 Low PT Treatments $Gait Training: 8-22 mins        04/18/2022  Ginger Carne., PT Acute Rehabilitation Services [phone]  (pager) [phone]  (office)  Tessie Fass Emylia Latella 04/18/2022, 5:22 PM

## 2022-04-18 NOTE — H&P (Signed)
TOTAL HIP ADMISSION H&P  Patient is admitted for right total hip arthroplasty.  Subjective:  Chief Complaint: right hip pain  HPI: Christopher Andersen, 51 y.o. male, has a history of pain and functional disability in the right hip(s) due to  avascular necrosis  and patient has failed non-surgical conservative treatments for greater than 12 weeks to include NSAID's and/or analgesics and activity modification.  Onset of symptoms was abrupt starting 1 years ago with rapidlly worsening course since that time.The patient noted no past surgery on the right hip(s).  Patient currently rates pain in the right hip at 10 out of 10 with activity. Patient has night pain, worsening of pain with activity and weight bearing, pain that interfers with activities of daily living, and pain with passive range of motion. Patient has evidence of subchondral cysts and avascular necrosis  by imaging studies. This condition presents safety issues increasing the risk of falls. This patient has had avascular necrosis of the hip, acetabular fracture, hip dysplasia.  There is no current active infection.  Patient Active Problem List   Diagnosis Date Noted   Avascular necrosis of hip, right ([place]) 02/06/2022   Avascular necrosis of hip, left ([facility]) 02/06/2022   Lateral epicondylitis of right elbow 05/10/2020   Anxiety 01/15/2018   Past Medical History:  Diagnosis Date   Anxiety    Arthritis    shoulder and neck   Asthma    as a child   Depression    GERD (gastroesophageal reflux disease)    occasional reflux. treat with OTC meds   Hypertension    Lipoma    removed from right face and current left inguinal   Pneumonia    few times as a child    Past Surgical History:  Procedure Laterality Date   LIPOMA EXCISION  2005   face   TONSILLECTOMY      Current Facility-Administered Medications  Medication Dose Route Frequency Provider Last Rate Last Admin   0.9 %  sodium chloride infusion  500 mL Intravenous Once Doran Stabler, MD       Current Outpatient Medications  Medication Sig Dispense Refill Last Dose   celecoxib (CELEBREX) 200 MG capsule Take 1 capsule (200 mg total) by mouth 2 (two) times daily between meals as needed. 60 capsule 3    cyclobenzaprine (FLEXERIL) 10 MG tablet TAKE 1 TABLET BY MOUTH EVERYDAY AT BEDTIME (Patient taking differently: Take 10 mg by mouth at bedtime as needed for muscle spasms.) 30 tablet 0    losartan (COZAAR) 50 MG tablet TAKE 1 TABLET BY MOUTH EVERY DAY 90 tablet 1    Multiple Vitamins-Minerals (HAIR/SKIN/NAILS/BIOTIN PO) Take 2 tablets by mouth daily.      zinc gluconate 50 MG tablet Take 50 mg by mouth daily.      traMADol (ULTRAM) 50 MG tablet TAKE 1-2 TABLETS BY MOUTH EVERY 6 HOURS AS NEEDED. 30 tablet 0    No Known Allergies  Social History   Tobacco Use   Smoking status: Former    Types: Cigarettes   Smokeless tobacco: Former  Substance Use Topics   Alcohol use: Yes    Comment: 3-5 beers daily.    Family History  Problem Relation Age of Onset   Dementia Mother    Hypertension Mother    Ulcers Mother    Diabetes Father    Non-Hodgkin's lymphoma Father    Colon cancer Neg Hx    Stomach cancer Neg Hx    Esophageal  cancer Neg Hx      Review of Systems  All other systems reviewed and are negative.   Objective:  Physical Exam Vitals reviewed.  Constitutional:      Appearance: Normal appearance.  HENT:     Head: Normocephalic and atraumatic.  Eyes:     Extraocular Movements: Extraocular movements intact.     Pupils: Pupils are equal, round, and reactive to light.  Cardiovascular:     Rate and Rhythm: Normal rate and regular rhythm.  Pulmonary:     Effort: Pulmonary effort is normal.     Breath sounds: Normal breath sounds.  Abdominal:     Palpations: Abdomen is soft.  Musculoskeletal:     Cervical back: Normal range of motion and neck supple.     Right hip: Tenderness and bony tenderness present. Decreased strength.  Neurological:      Mental Status: He is alert and oriented to person, place, and time.  Psychiatric:        Behavior: Behavior normal.     Vital signs in last 24 hours:    Labs:   Estimated body mass index is 27.33 kg/m as calculated from the following:   Height as of 04/07/22: 6' (1.829 m).   Weight as of 04/07/22: 91.4 kg.   Imaging Review Plain radiographs demonstrate severe AVN of the right hip(s). The bone quality appears to be excellent for age and reported activity level.      Assessment/Plan:  Avascular necrosis, right hip(s)  The patient history, physical examination, clinical judgement of the provider and imaging studies are consistent with AVN of the right hip(s) and total hip arthroplasty is deemed medically necessary. The treatment options including medical management, injection therapy, arthroscopy and arthroplasty were discussed at length. The risks and benefits of total hip arthroplasty were presented and reviewed. The risks due to aseptic loosening, infection, stiffness, dislocation/subluxation,  thromboembolic complications and other imponderables were discussed.  The patient acknowledged the explanation, agreed to proceed with the plan and consent was signed. Patient is being admitted for inpatient treatment for surgery, pain control, PT, OT, prophylactic antibiotics, VTE prophylaxis, progressive ambulation and ADL's and discharge planning.The patient is planning to be discharged home with home health services

## 2022-04-18 NOTE — Transfer of Care (Signed)
Immediate Anesthesia Transfer of Care Note  Patient: Christopher Andersen  Procedure(s) Performed: RIGHT TOTAL HIP ARTHROPLASTY ANTERIOR APPROACH (Right: Hip)  Patient Location: PACU  Anesthesia Type:MAC and Spinal  Level of Consciousness: awake and oriented  Airway & Oxygen Therapy: Patient Spontanous Breathing  Post-op Assessment: Report given to RN  Post vital signs: Reviewed and stable  Last Vitals:  Vitals Value Taken Time  BP 103/61 04/18/22 1343  Temp    Pulse 72 04/18/22 1344  Resp 14 04/18/22 1344  SpO2 97 % 04/18/22 1344  Vitals shown include unvalidated device data.  Last Pain:  Vitals:   04/18/22 0956  TempSrc:   PainSc: 0-No pain         Complications: No notable events documented.

## 2022-04-18 NOTE — Anesthesia Preprocedure Evaluation (Signed)
Anesthesia Evaluation  Patient identified by MRN, date of birth, ID band Patient awake    Reviewed: Allergy & Precautions, NPO status , Patient's Chart, lab work & pertinent test results, reviewed documented beta blocker date and time   Airway Mallampati: II  TM Distance: >3 FB Neck ROM: Full    Dental no notable dental hx. (+) Teeth Intact, Dental Advisory Given   Pulmonary asthma , pneumonia, resolved, former smoker,    Pulmonary exam normal breath sounds clear to auscultation       Cardiovascular hypertension, Pt. on medications Normal cardiovascular exam Rhythm:Regular Rate:Normal     Neuro/Psych PSYCHIATRIC DISORDERS Anxiety Depression negative neurological ROS     GI/Hepatic Neg liver ROS, GERD  Medicated,  Endo/Other  negative endocrine ROS  Renal/GU negative Renal ROS     Musculoskeletal  (+) Arthritis , Osteoarthritis,  AVN right hip   Abdominal   Peds  Hematology negative hematology ROS (+)   Anesthesia Other Findings   Reproductive/Obstetrics                             Anesthesia Physical Anesthesia Plan  ASA: 2  Anesthesia Plan: Spinal   Post-op Pain Management: Regional block* and Minimal or no pain anticipated   Induction: Intravenous  PONV Risk Score and Plan: 2 and Treatment may vary due to age or medical condition, Propofol infusion, Ondansetron and Midazolam  Airway Management Planned: Natural Airway and Simple Face Mask  Additional Equipment: None  Intra-op Plan:   Post-operative Plan:   Informed Consent: I have reviewed the patients History and Physical, chart, labs and discussed the procedure including the risks, benefits and alternatives for the proposed anesthesia with the patient or authorized representative who has indicated his/her understanding and acceptance.     Dental advisory given  Plan Discussed with: CRNA and  Anesthesiologist  Anesthesia Plan Comments:         Anesthesia Quick Evaluation

## 2022-04-18 NOTE — Anesthesia Procedure Notes (Addendum)
Spinal  Patient location during procedure: OR Start time: 04/18/2022 12:09 PM End time: 04/18/2022 12:12 PM Reason for block: surgical anesthesia Staffing Performed: anesthesiologist  Anesthesiologist: Josephine Igo, MD Performed by: Josephine Igo, MD Authorized by: Josephine Igo, MD   Preanesthetic Checklist Completed: patient identified, IV checked, site marked, risks and benefits discussed, surgical consent, monitors and equipment checked, pre-op evaluation and timeout performed Spinal Block Patient position: sitting Prep: DuraPrep and site prepped and draped Patient monitoring: heart rate, cardiac monitor, continuous pulse ox and blood pressure Approach: midline Location: L3-4 Injection technique: single-shot Needle Needle type: Pencan  Needle gauge: 24 G Needle length: 9 cm Needle insertion depth: 7 cm Assessment Sensory level: T4 Events: CSF return Additional Notes Patient tolerated procedure well. Adequate sensory level.

## 2022-04-18 NOTE — Anesthesia Postprocedure Evaluation (Signed)
Anesthesia Post Note  Patient: Christopher Andersen  Procedure(s) Performed: RIGHT TOTAL HIP ARTHROPLASTY ANTERIOR APPROACH (Right: Hip)     Patient location during evaluation: PACU Anesthesia Type: Spinal Level of consciousness: oriented and awake and alert Pain management: pain level controlled Vital Signs Assessment: post-procedure vital signs reviewed and stable Respiratory status: spontaneous breathing, respiratory function stable and nonlabored ventilation Cardiovascular status: blood pressure returned to baseline and stable Postop Assessment: no headache, no backache, no apparent nausea or vomiting, patient able to bend at knees and spinal receding Anesthetic complications: no   No notable events documented.  Last Vitals:  Vitals:   04/18/22 1343 04/18/22 1400  BP: 103/61 115/80  Pulse: 71 63  Resp: 18 14  Temp: (!) 36.1 C   SpO2: 97% 99%    Last Pain:  Vitals:   04/18/22 1343  TempSrc:   PainSc: 0-No pain                 Shoaib Siefker A.

## 2022-04-18 NOTE — Op Note (Signed)
NAME: Christopher Andersen, Christopher Andersen. MEDICAL RECORD NO: [id number] ACCOUNT NO: [id number] DATE OF BIRTH: December 20, 1970 FACILITY: [facility] LOCATION: [facility] PHYSICIAN: Lind Guest. Ninfa Linden, MD  Operative Report   DATE OF PROCEDURE: 04/18/2022  PREOPERATIVE DIAGNOSIS:  End-stage avascular necrosis, right hip.  POSTOPERATIVE DIAGNOSIS:  End-stage avascular necrosis, right hip.  PROCEDURE:  Right total hip arthroplasty through direct anterior approach.  IMPLANTS:  DePuy sector Gription acetabular component size 54, size 36+4 neutral polyethylene liner, size 5 ACTIS femoral component with high offset, size 36+5 ceramic hip ball.  SURGEON:  Lind Guest. Ninfa Linden MD  ASSISTANT:  Benita Stabile, PA-C  ANESTHESIA:  Spinal.  ANTIBIOTICS:  2 g IV Ancef.  ESTIMATED BLOOD LOSS:  240 mL  COMPLICATIONS:  None.  INDICATIONS:  The patient is a 51 year old gentleman with known and well documented avascular necrosis involving both his hips.  This gotten rapidly worse.  This is I believe idiopathic AVN.  Both hips hurt severely and we are proceeding with a right  total hip arthroplasty today.  This has been the worst looking one on MRI and plain films, but his left hip is hurting him quite a bit.  We had a long and thorough discussion about the risk of acute blood loss anemia, nerve or vessel injury, fracture,  infection, DVT, implant failure, leg length differences and skin and soft tissue issues.  We talked about dislocation as well.  We talked about our goals being decreased pain, improve mobility and overall improved quality of life.  He understands that we  will replace his right hip today and then as soon as we are able we can proceed with his left hip once he is recovering from his right hip replacement.  DESCRIPTION OF PROCEDURE:  After informed consent was obtained, appropriate right hip was marked.  He was brought to the operating room and sat up on the stretcher where spinal anesthesia was obtained.  He was  laid in the supine position on the  stretcher.  A Foley catheter was placed and traction boots were placed on both his feet.  Next, he was placed supine on the Hana fracture table, the perineal post in place and both legs in line skeletal traction device and no traction applied.  His right  operative hip was prepped and draped in DuraPrep and sterile drapes.  A timeout was called.  He was identified as correct patient, correct right hip.  I then made an incision just inferior and posterior to the anterior superior iliac spine and carried  this obliquely down the leg.  We dissected down tensor fascia lata muscle.  Tensor fascia was then divided longitudinally to proceed with direct anterior approach to the hip.  I identified and cauterized circumflex vessels and identified the hip capsule,  opened the hip capsule in L-type format finding a moderate joint effusion.  We made a femoral neck cut just proximal to the lesser trochanter with an oscillating saw and completed this with an osteotome and removed the femoral head in its entirety.  We  had a large area of cartilage just flaked off from his avascular necrosis.  We then removed remnants of acetabular labrum and other debris and placed a bent Hohmann over the medial acetabular rim and I then began reaming under direct visualization from a  size 43 reamer in a stepwise increments going up to a size 53 reamer with all reamers placed under direct visualization.  Last reamer was also placed under direct fluoroscopy, so I could obtain my depth  of reaming, my inclination and anteversion.  I  then placed real DePuy sector Gription acetabular component, size 54 with a 36+4 polyethylene liner based on medialization of the cup and his offset.  Attention was then turned to the femur.  With the leg externally rotated to 120 degrees and extended  and adducted we were able to place a Mueller retractor medially and Hohman retractor behind the greater trochanter.  We  released lateral joint capsule and used a box cutting osteotome to enter the femoral canal and a rongeur to lateralize.  We then began  broaching using the ACTIS broaching system from a size 0 going to a size 5.  With a size 5 in place, we trialed a standard offset femoral neck and a 36+1.5 hip ball, we reduced this in acetabulum.  We definitely needed more offset and leg length.  It  was not stable.  We dislocated the hip and removed the trial components.  We placed the real ACTIS femoral component, size 5 with high offset component and for more leg length we went with a 36+5 ceramic hip ball and again we reduced this in the  acetabulum.  We were pleased with leg length, offset, range of motion and stability assessed mechanically and radiographically.  We then irrigated the soft tissue with normal saline solution using pulsatile lavage.  The joint capsule was closed with  interrupted #1 Ethibond suture followed by #1 Vicryl to close the tensor fascia.  0 Vicryl was used to close deep tissue and 2-0 Vicryl was used to close subcutaneous tissue.  The skin was closed with staples.  An Aquacel dressing was applied.  He was  taken off the Hana table and taken to recovery room in stable condition with all final counts were correct.  No complications noted.  Of note, Benita Stabile, PA-C, assisted during the entire case from beginning to end.  His assistance was medically necessary  and crucial for helping mobilize and retract soft tissues as well as helping guide implant placement.  He was directly involved with a layered closure of the wound.   PUS D: 04/18/2022 1:21:19 pm T: 04/18/2022 2:22:00 pm  JOB: [id number]/ [id number]

## 2022-04-19 ENCOUNTER — Encounter (HOSPITAL_COMMUNITY): Payer: Self-pay | Admitting: Orthopaedic Surgery

## 2022-04-19 DIAGNOSIS — M25559 Pain in unspecified hip: Secondary | ICD-10-CM | POA: Diagnosis not present

## 2022-04-19 DIAGNOSIS — M87051 Idiopathic aseptic necrosis of right femur: Secondary | ICD-10-CM | POA: Diagnosis not present

## 2022-04-19 DIAGNOSIS — I10 Essential (primary) hypertension: Secondary | ICD-10-CM | POA: Diagnosis not present

## 2022-04-19 DIAGNOSIS — Z79899 Other long term (current) drug therapy: Secondary | ICD-10-CM | POA: Diagnosis not present

## 2022-04-19 DIAGNOSIS — J45909 Unspecified asthma, uncomplicated: Secondary | ICD-10-CM | POA: Diagnosis not present

## 2022-04-19 DIAGNOSIS — M25519 Pain in unspecified shoulder: Secondary | ICD-10-CM | POA: Insufficient documentation

## 2022-04-19 DIAGNOSIS — Z87891 Personal history of nicotine dependence: Secondary | ICD-10-CM | POA: Diagnosis not present

## 2022-04-19 LAB — CBC
HCT: 37.6 % — ABNORMAL LOW (ref 39.0–52.0)
Hemoglobin: 12.8 g/dL — ABNORMAL LOW (ref 13.0–17.0)
MCH: 31.8 pg (ref 26.0–34.0)
MCHC: 34 g/dL (ref 30.0–36.0)
MCV: 93.5 fL (ref 80.0–100.0)
Platelets: 204 10*3/uL (ref 150–400)
RBC: 4.02 MIL/uL — ABNORMAL LOW (ref 4.22–5.81)
RDW: 12.6 % (ref 11.5–15.5)
WBC: 8.3 10*3/uL (ref 4.0–10.5)
nRBC: 0 % (ref 0.0–0.2)

## 2022-04-19 LAB — BASIC METABOLIC PANEL
Anion gap: 9 (ref 5–15)
BUN: 10 mg/dL (ref 6–20)
CO2: 26 mmol/L (ref 22–32)
Calcium: 8.8 mg/dL — ABNORMAL LOW (ref 8.9–10.3)
Chloride: 102 mmol/L (ref 98–111)
Creatinine, Ser: 1.08 mg/dL (ref 0.61–1.24)
GFR, Estimated: >60 mL/min (ref >60)
Glucose, Bld: 120 mg/dL — ABNORMAL HIGH (ref 70–99)
Potassium: 3.8 mmol/L (ref 3.5–5.1)
Sodium: 137 mmol/L (ref 135–145)

## 2022-04-19 MED ORDER — OXYCODONE HCL 5 MG PO TABS: 5.0000 mg | ORAL_TABLET | Freq: Four times a day (QID) | ORAL | 0 refills | Status: DC | PRN

## 2022-04-19 MED ORDER — ASPIRIN 81 MG PO CHEW: 81.0000 mg | CHEWABLE_TABLET | Freq: Two times a day (BID) | ORAL | 0 refills | Status: DC

## 2022-04-19 MED ORDER — METHOCARBAMOL 500 MG PO TABS: 500.0000 mg | ORAL_TABLET | Freq: Four times a day (QID) | ORAL | 1 refills | Status: DC | PRN

## 2022-04-19 NOTE — Progress Notes (Signed)
Subjective: 1 Day Post-Op Procedure(s) (LRB): RIGHT TOTAL HIP ARTHROPLASTY ANTERIOR APPROACH (Right) Patient reports pain as moderate.  No complaints otherwise. Voiding well.   Objective: Vital signs in last 24 hours: Temp:  [96.9 F (36.1 C)-98.7 F (37.1 C)] 97.9 F (36.6 C) (06/21 0733) Pulse Rate:  [58-80] 80 (06/21 0733) Resp:  [9-18] 15 (06/21 0458) BP: (103-138)/(61-93) 131/88 (06/21 0733) SpO2:  [96 %-100 %] 99 % (06/21 0458) Weight:  [90.7 kg] 90.7 kg (06/20 0953)  Intake/Output from previous day: 06/20 0701 - 06/21 0700 In: 1540 [P.O.:240; I.V.:1300] Out: 1800 [Urine:1550; Blood:250] Intake/Output this shift: No intake/output data recorded.  Recent Labs    04/19/22 0732  HGB 12.8*   Recent Labs    04/19/22 0732  WBC 8.3  RBC 4.02*  HCT 37.6*  PLT 204   Recent Labs    04/19/22 0732  NA 137  K 3.8  CL 102  CO2 26  BUN 10  CREATININE 1.08  GLUCOSE 120*  CALCIUM 8.8*   No results for input(s): "LABPT", "INR" in the last 72 hours.  Right lower extremity: Dorsiflexion/Plantar flexion intact Incision: dressing C/D/I Compartment soft   Assessment/Plan: 1 Day Post-Op Procedure(s) (LRB): RIGHT TOTAL HIP ARTHROPLASTY ANTERIOR APPROACH (Right) Up with therapy Discharge home with home health      Erskine Emery 04/19/2022, 8:29 AM

## 2022-04-19 NOTE — Discharge Instructions (Signed)

## 2022-04-19 NOTE — TOC Transition Note (Signed)
Transition of Care [facility]) - CM/SW Discharge Note   Patient Details  Name: Christopher Andersen MRN: [id number] Date of Birth: 11-May-1971  Transition of Care [facility]) CM/SW Contact:  Sharin Mons, RN Phone Number: 04/19/2022, 10:48 AM   Clinical Narrative:    Patient will DC to: home Anticipated DC date: 04/19/2022 Family notified: yes Transport by: car           S/p elective R THA, 04/19/2022 Per MD patient ready for DC today. RN, patient,  and patient's girlfriend [name] notified of DC. Girlfriend to assist with care once d/c. Orders noted for home health services ( pt agreeable)  and DME . Referral made with [facility] and accepted. Referral made with  [facility] for RW. Pt states no need for BSC. RW will be delivered to bedside prior to d/c. Post hospital f/u noted on AVS.  Pt without Rx med concerns.  RNCM will sign off for now as intervention is no longer needed. Please consult [place] again if new needs arise.     Final next level of care: [place] Barriers to Discharge: No Barriers Identified   Patient Goals and CMS Choice     Choice offered to / list presented to : Patient  Discharge Placement                       Discharge Plan and Services                DME Arranged: Walker rolling (Already has [facility] @ home) DME Agency: [facility] Date DME Agency Contacted: 04/19/22 Time DME Agency Contacted: [id number] Representative spoke with at DME Agency: [place]: PT [place]: [place] ([place]) Date [place]: 04/19/22 Time [place]: 2446 Representative spoke with at [place]: [place] ([place]) Interventions     Readmission Risk Interventions     No data to display

## 2022-04-19 NOTE — Progress Notes (Addendum)
Discharge instructions given to patient with detailed medication administration for home. Patient verbalized understanding. Care plan is resolved per MD order is ready for discharge to home.   New Entry:  After discussion of discharge patient verbalized that he has steps in his home. All his personal needs are upstairs.  Have reached out to PA to order PT for this patient prior to discharge.

## 2022-04-19 NOTE — Progress Notes (Signed)
Physical Therapy Treatment Patient Details Name: Christopher Andersen MRN: [id number] DOB: 1971-01-08 Today's Date: 04/19/2022   History of Present Illness pt is a 51 y/o male admitted 6/20 for elective R THA due to avascular necrosis and 10/10 pain.  PMHx: HTN, anxiety,    PT Comments    Pt did well with stair and gait training. Pt required UE use during heel slides and hip abduction in supine 2/2 R LE weakness. Pt unable to perform SLR. Pt ready for discharge home from physical standpoint but will be seen again this afternoon if he is still here.  Recommendations for follow up therapy are one component of a multi-disciplinary discharge planning process, led by the attending physician.  Recommendations may be updated based on patient status, additional functional criteria and insurance authorization.  Follow Up Recommendations  Follow physician's recommendations for discharge plan and follow up therapies     Assistance Recommended at Discharge PRN  Patient can return home with the following A lot of help with bathing/dressing/bathroom;Assistance with feeding;Assist for transportation;Help with stairs or ramp for entrance   Equipment Recommendations  Rolling walker (2 wheels)    Recommendations for Other Services       Precautions / Restrictions Precautions Precautions: Fall (no hip precautions per MD order) Precaution Booklet Issued:  (surgical hip exercises given and addressed with pt/wife.) Restrictions Weight Bearing Restrictions: Yes RLE Weight Bearing: Weight bearing as tolerated     Mobility  Bed Mobility Overal bed mobility: Needs Assistance Bed Mobility: Supine to Sit     Supine to sit: Supervision     General bed mobility comments: pt assisted R LE with his hands    Transfers Overall transfer level: Needs assistance   Transfers: Sit to/from Stand Sit to Stand: Min guard           General transfer comment: cues for hand placement and safety awareness     Ambulation/Gait Ambulation/Gait assistance: Supervision, Min guard Gait Distance (Feet): 150 Feet Assistive device: Rolling walker (2 wheels) Gait Pattern/deviations: Step-to pattern, Decreased stance time - right, Antalgic       General Gait Details: Pt instructed in and performed modified three point pattern. Pt required cues to push walker rather than picking it up to advance it.   Stairs Stairs: Yes Stairs assistance: Min guard Stair Management: Forwards, Step to pattern, One rail Right Number of Stairs: 12 General stair comments: Pt ascended and descended 12 stairs utilizing R rail (forwards). Pt able to complete without L HHA but did move over L hand to R rail. Pt also ascended and descended 2 stairs sideways utilizing R rail for alternate technique. No LOB occurred.   Wheelchair Mobility    Modified Rankin (Stroke Patients Only)       Balance Overall balance assessment: No apparent balance deficits (not formally assessed)                                          Cognition Arousal/Alertness: Awake/alert Behavior During Therapy: WFL for tasks assessed/performed Overall Cognitive Status: Within Functional Limits for tasks assessed                                          Exercises Total Joint Exercises Quad Sets: Right, 10 reps, Supine (3 sec holds) Heel Slides:  Right, 10 reps, Supine Hip ABduction/ADduction: Right, 10 reps, Supine Long Arc Quad: Right, 10 reps, Seated    General Comments        Pertinent Vitals/Pain Pain Assessment Pain Assessment: 0-10 Pain Score: 5  Pain Location: R hip surgical site Pain Descriptors / Indicators: Dull Pain Intervention(s): Limited activity within patient's tolerance, Ice applied    Home Living                          Prior Function            PT Goals (current goals can now be found in the care plan section) Acute Rehab PT Goals Patient Stated Goal: ready for  next surgery, back to work PT Goal Formulation: With patient Time For Goal Achievement: 04/21/22 Potential to Achieve Goals: Good Progress towards PT goals: Progressing toward goals    Frequency    7X/week      PT Plan Current plan remains appropriate    Co-evaluation              AM-PAC PT "6 Clicks" Mobility   Outcome Measure  Help needed turning from your back to your side while in a flat bed without using bedrails?: None Help needed moving from lying on your back to sitting on the side of a flat bed without using bedrails?: None Help needed moving to and from a bed to a chair (including a wheelchair)?: A Little Help needed standing up from a chair using your arms (e.g., wheelchair or bedside chair)?: A Little Help needed to walk in hospital room?: A Little Help needed climbing 3-5 steps with a railing? : A Little 6 Click Score: 20    End of Session Equipment Utilized During Treatment: Gait belt Activity Tolerance: Patient tolerated treatment well;Patient limited by pain Patient left: in chair;with family/visitor present;with call bell/phone within reach Nurse Communication: Mobility status PT Visit Diagnosis: Other abnormalities of gait and mobility (R26.89);Pain Pain - Right/Left: Right Pain - part of body: Hip     Time: 1002-1029 PT Time Calculation (min) (ACUTE ONLY): 27 min  Charges:  $Gait Training: 8-22 mins $Therapeutic Exercise: 8-22 mins                    Donna Bernard, PT    [facility] 04/19/2022, 10:41 AM

## 2022-04-19 NOTE — Discharge Summary (Signed)
Patient ID: SUTTON HIRSCH MRN: [id number] DOB/AGE: 1971-02-24 51 y.o.  Admit date: 04/18/2022 Discharge date: 04/19/2022  Admission Diagnoses:  Principal Problem:   Avascular necrosis of hip, right ([place]) Active Problems:   Status post total replacement of right hip   Discharge Diagnoses:  Status post right total hip arthroplasty  Past Medical History:  Diagnosis Date   Anxiety    Arthritis    shoulder and neck   Asthma    as a child   Depression    GERD (gastroesophageal reflux disease)    occasional reflux. treat with OTC meds   Hypertension    Lipoma    removed from right face and current left inguinal   Pneumonia    few times as a child    Surgeries: Procedure(s): RIGHT TOTAL HIP ARTHROPLASTY ANTERIOR APPROACH on 04/18/2022   Consultants:   Discharged Condition: Improved  Hospital Course: JENTZEN MINASYAN is an 51 y.o. male who was admitted 04/18/2022 for operative treatment ofAvascular necrosis of hip, right ([place]). Patient has severe unremitting pain that affects sleep, daily activities, and work/hobbies. After pre-op clearance the patient was taken to the operating room on 04/18/2022 and underwent  Procedure(s): RIGHT TOTAL HIP ARTHROPLASTY ANTERIOR APPROACH.    Patient was given perioperative antibiotics:  Anti-infectives (From admission, onward)    Start     Dose/Rate Route Frequency Ordered Stop   04/18/22 1800  ceFAZolin (ANCEF) IVPB 1 g/50 mL premix        1 g 100 mL/hr over 30 Minutes Intravenous Every 6 hours 04/18/22 1530 04/19/22 0055   04/18/22 1000  ceFAZolin (ANCEF) IVPB 2g/100 mL premix        2 g 200 mL/hr over 30 Minutes Intravenous On call to O.R. 04/18/22 0954 04/18/22 1217        Patient was given sequential compression devices, early ambulation, and chemoprophylaxis to prevent DVT.  Patient benefited maximally from hospital stay and there were no complications.    Recent vital signs: Patient Vitals for the past 24 hrs:  BP Temp  Temp src Pulse Resp SpO2 Height Weight  04/19/22 0733 131/88 97.9 F (36.6 C) Oral 80 -- -- -- --  04/19/22 0500 -- 98.7 F (37.1 C) Oral -- -- -- -- --  04/19/22 0458 131/86 -- -- 74 15 99 % -- --  04/19/22 0015 117/70 98.3 F (36.8 C) -- 76 15 100 % -- --  04/18/22 2033 116/76 98.5 F (36.9 C) Oral 77 16 100 % -- --  04/18/22 1759 -- -- -- -- 18 -- -- --  04/18/22 1640 -- -- -- -- 17 -- -- --  04/18/22 1555 -- -- -- -- 18 -- -- --  04/18/22 1513 118/71 -- -- 65 15 96 % -- --  04/18/22 1458 113/82 -- -- 66 14 99 % -- --  04/18/22 1445 113/87 97.9 F (36.6 C) -- 60 14 99 % -- --  04/18/22 1430 126/88 -- -- (!) 59 (!) 9 99 % -- --  04/18/22 1415 125/77 -- -- (!) 58 11 99 % -- --  04/18/22 1400 115/80 -- -- 63 14 99 % -- --  04/18/22 1343 103/61 (!) 96.9 F (36.1 C) -- 71 18 97 % -- --  04/18/22 0953 (!) 138/93 97.7 F (36.5 C) Oral 78 18 96 % 6' (1.829 m) 90.7 kg     Recent laboratory studies:  Recent Labs    04/19/22 0732  WBC 8.3  HGB 12.8*  HCT  37.6*  PLT 204  NA 137  K 3.8  CL 102  CO2 26  BUN 10  CREATININE 1.08  GLUCOSE 120*  CALCIUM 8.8*     Discharge Medications:   Allergies as of 04/19/2022   No Known Allergies      Medication List     STOP taking these medications    celecoxib 200 MG capsule Commonly known as: CELEBREX   cyclobenzaprine 10 MG tablet Commonly known as: FLEXERIL   traMADol 50 MG tablet Commonly known as: ULTRAM       TAKE these medications    aspirin 81 MG chewable tablet Chew 1 tablet (81 mg total) by mouth 2 (two) times daily.   HAIR/SKIN/NAILS/BIOTIN PO Take 2 tablets by mouth daily.   losartan 50 MG tablet Commonly known as: COZAAR TAKE 1 TABLET BY MOUTH EVERY DAY   methocarbamol 500 MG tablet Commonly known as: ROBAXIN Take 1 tablet (500 mg total) by mouth every 6 (six) hours as needed for muscle spasms.   oxyCODONE 5 MG immediate release tablet Commonly known as: Oxy IR/ROXICODONE Take 1-2 tablets (5-10  mg total) by mouth every 6 (six) hours as needed for moderate pain (pain score 4-6).   zinc gluconate 50 MG tablet Take 50 mg by mouth daily.               Durable Medical Equipment  (From admission, onward)           Start     Ordered   04/18/22 1531  DME 3 n 1  Once        04/18/22 1530   04/18/22 1531  DME Walker rolling  Once       Question Answer Comment  Walker: With 5 Inch Wheels   Patient needs a walker to treat with the following condition Status post total replacement of right hip      04/18/22 1530            Diagnostic Studies: DG Pelvis Portable  Result Date: 04/18/2022 CLINICAL DATA:  Status post right hip arthroplasty EXAM: PORTABLE PELVIS 1-2 VIEWS COMPARISON:  None Available. FINDINGS: Status post right hip total arthroplasty with expected overlying postoperative change. No evidence of perihardware fracture or component malpositioning. IMPRESSION: Status post right hip total arthroplasty with expected overlying postoperative change. No evidence of perihardware fracture or component malpositioning. Electronically Signed   By: Delanna Ahmadi M.D.   On: 04/18/2022 14:32   DG HIP UNILAT WITH PELVIS 1V RIGHT  Result Date: 04/18/2022 CLINICAL DATA:  Anterior right hip replacement EXAM: DG HIP (WITH OR WITHOUT PELVIS) 1V RIGHT; DG C-ARM 1-60 MIN-NO REPORT COMPARISON:  01/24/2022 right hip MRI FLUOROSCOPY TIME:  Radiation Exposure Index (if provided by the fluoroscopic device): 1.4 mGy FINDINGS: Three spot fluoroscopic nondiagnostic intraoperative right hip radiographs demonstrate expected postsurgical changes from right total hip arthroplasty with no evidence of right hip dislocation on these views. IMPRESSION: Intraoperative fluoroscopic guidance for right total hip arthroplasty. Electronically Signed   By: Ilona Sorrel M.D.   On: 04/18/2022 13:27   DG C-Arm 1-60 Min-No Report  Result Date: 04/18/2022 Fluoroscopy was utilized by the requesting physician.  No  radiographic interpretation.    Disposition: Discharge disposition: 06-Home-Health Care Svc          Follow-up Information     Mcarthur Rossetti, MD Follow up in 2 week(s).   Specialty: Orthopedic Surgery Contact information: [address] [place] [address] [phone]  SignedErskine Emery 04/19/2022, 8:49 AM

## 2022-04-20 ENCOUNTER — Telehealth: Payer: Self-pay | Admitting: Orthopaedic Surgery

## 2022-04-20 ENCOUNTER — Telehealth: Payer: Self-pay

## 2022-04-20 DIAGNOSIS — Z7982 Long term (current) use of aspirin: Secondary | ICD-10-CM | POA: Diagnosis not present

## 2022-04-20 DIAGNOSIS — K219 Gastro-esophageal reflux disease without esophagitis: Secondary | ICD-10-CM | POA: Diagnosis not present

## 2022-04-20 DIAGNOSIS — M87052 Idiopathic aseptic necrosis of left femur: Secondary | ICD-10-CM | POA: Diagnosis not present

## 2022-04-20 DIAGNOSIS — M150 Primary generalized (osteo)arthritis: Secondary | ICD-10-CM | POA: Diagnosis not present

## 2022-04-20 DIAGNOSIS — F32A Depression, unspecified: Secondary | ICD-10-CM | POA: Diagnosis not present

## 2022-04-20 DIAGNOSIS — F419 Anxiety disorder, unspecified: Secondary | ICD-10-CM | POA: Diagnosis not present

## 2022-04-20 DIAGNOSIS — Z471 Aftercare following joint replacement surgery: Secondary | ICD-10-CM | POA: Diagnosis not present

## 2022-04-20 DIAGNOSIS — Z96641 Presence of right artificial hip joint: Secondary | ICD-10-CM | POA: Diagnosis not present

## 2022-04-20 DIAGNOSIS — I10 Essential (primary) hypertension: Secondary | ICD-10-CM | POA: Diagnosis not present

## 2022-04-20 DIAGNOSIS — Z79891 Long term (current) use of opiate analgesic: Secondary | ICD-10-CM | POA: Diagnosis not present

## 2022-04-20 NOTE — Telephone Encounter (Signed)
Verbal order given  

## 2022-04-20 NOTE — Telephone Encounter (Signed)
Patient called stating that he noticed some redness around his right hip.  Would like to know if he should be concerned?  Patient had right hip surgery on 04/18/2022.  Cb# [phone].  Please advise.

## 2022-04-20 NOTE — Telephone Encounter (Signed)
Received call from [facility] (PT) with [place] needing verbal orders for HHPT 2 Wk 1, 3  Wk 1  and 2  Wk 1 The number to contact [place] is [phone]

## 2022-04-21 DIAGNOSIS — M25559 Pain in unspecified hip: Secondary | ICD-10-CM | POA: Diagnosis not present

## 2022-04-22 ENCOUNTER — Other Ambulatory Visit: Payer: Self-pay | Admitting: Orthopaedic Surgery

## 2022-04-22 DIAGNOSIS — I10 Essential (primary) hypertension: Secondary | ICD-10-CM | POA: Diagnosis not present

## 2022-04-22 DIAGNOSIS — M87052 Idiopathic aseptic necrosis of left femur: Secondary | ICD-10-CM | POA: Diagnosis not present

## 2022-04-22 DIAGNOSIS — Z471 Aftercare following joint replacement surgery: Secondary | ICD-10-CM | POA: Diagnosis not present

## 2022-04-22 DIAGNOSIS — Z79891 Long term (current) use of opiate analgesic: Secondary | ICD-10-CM | POA: Diagnosis not present

## 2022-04-22 DIAGNOSIS — Z96641 Presence of right artificial hip joint: Secondary | ICD-10-CM | POA: Diagnosis not present

## 2022-04-22 DIAGNOSIS — K219 Gastro-esophageal reflux disease without esophagitis: Secondary | ICD-10-CM | POA: Diagnosis not present

## 2022-04-22 DIAGNOSIS — M150 Primary generalized (osteo)arthritis: Secondary | ICD-10-CM | POA: Diagnosis not present

## 2022-04-22 DIAGNOSIS — F419 Anxiety disorder, unspecified: Secondary | ICD-10-CM | POA: Diagnosis not present

## 2022-04-22 DIAGNOSIS — Z7982 Long term (current) use of aspirin: Secondary | ICD-10-CM | POA: Diagnosis not present

## 2022-04-22 DIAGNOSIS — F32A Depression, unspecified: Secondary | ICD-10-CM | POA: Diagnosis not present

## 2022-04-22 MED ORDER — OXYCODONE HCL 5 MG PO TABS: 5.0000 mg | ORAL_TABLET | Freq: Four times a day (QID) | ORAL | 0 refills | Status: DC | PRN

## 2022-04-24 ENCOUNTER — Encounter: Payer: Self-pay | Admitting: Orthopaedic Surgery

## 2022-04-24 DIAGNOSIS — M87052 Idiopathic aseptic necrosis of left femur: Secondary | ICD-10-CM | POA: Diagnosis not present

## 2022-04-24 DIAGNOSIS — Z96641 Presence of right artificial hip joint: Secondary | ICD-10-CM | POA: Diagnosis not present

## 2022-04-24 DIAGNOSIS — Z471 Aftercare following joint replacement surgery: Secondary | ICD-10-CM | POA: Diagnosis not present

## 2022-04-24 DIAGNOSIS — F419 Anxiety disorder, unspecified: Secondary | ICD-10-CM | POA: Diagnosis not present

## 2022-04-24 DIAGNOSIS — F32A Depression, unspecified: Secondary | ICD-10-CM | POA: Diagnosis not present

## 2022-04-24 DIAGNOSIS — Z79891 Long term (current) use of opiate analgesic: Secondary | ICD-10-CM | POA: Diagnosis not present

## 2022-04-24 DIAGNOSIS — M150 Primary generalized (osteo)arthritis: Secondary | ICD-10-CM | POA: Diagnosis not present

## 2022-04-24 DIAGNOSIS — I10 Essential (primary) hypertension: Secondary | ICD-10-CM | POA: Diagnosis not present

## 2022-04-24 DIAGNOSIS — Z7982 Long term (current) use of aspirin: Secondary | ICD-10-CM | POA: Diagnosis not present

## 2022-04-24 DIAGNOSIS — K219 Gastro-esophageal reflux disease without esophagitis: Secondary | ICD-10-CM | POA: Diagnosis not present

## 2022-04-26 DIAGNOSIS — F419 Anxiety disorder, unspecified: Secondary | ICD-10-CM | POA: Diagnosis not present

## 2022-04-26 DIAGNOSIS — M150 Primary generalized (osteo)arthritis: Secondary | ICD-10-CM | POA: Diagnosis not present

## 2022-04-26 DIAGNOSIS — Z79891 Long term (current) use of opiate analgesic: Secondary | ICD-10-CM | POA: Diagnosis not present

## 2022-04-26 DIAGNOSIS — I10 Essential (primary) hypertension: Secondary | ICD-10-CM | POA: Diagnosis not present

## 2022-04-26 DIAGNOSIS — K219 Gastro-esophageal reflux disease without esophagitis: Secondary | ICD-10-CM | POA: Diagnosis not present

## 2022-04-26 DIAGNOSIS — Z96641 Presence of right artificial hip joint: Secondary | ICD-10-CM | POA: Diagnosis not present

## 2022-04-26 DIAGNOSIS — Z471 Aftercare following joint replacement surgery: Secondary | ICD-10-CM | POA: Diagnosis not present

## 2022-04-26 DIAGNOSIS — Z7982 Long term (current) use of aspirin: Secondary | ICD-10-CM | POA: Diagnosis not present

## 2022-04-26 DIAGNOSIS — F32A Depression, unspecified: Secondary | ICD-10-CM | POA: Diagnosis not present

## 2022-04-26 DIAGNOSIS — M87052 Idiopathic aseptic necrosis of left femur: Secondary | ICD-10-CM | POA: Diagnosis not present

## 2022-04-27 ENCOUNTER — Other Ambulatory Visit: Payer: Self-pay | Admitting: Orthopaedic Surgery

## 2022-04-27 MED ORDER — OXYCODONE HCL 5 MG PO TABS: 5.0000 mg | ORAL_TABLET | Freq: Four times a day (QID) | ORAL | 0 refills | Status: DC | PRN

## 2022-04-28 DIAGNOSIS — F32A Depression, unspecified: Secondary | ICD-10-CM | POA: Diagnosis not present

## 2022-04-28 DIAGNOSIS — Z471 Aftercare following joint replacement surgery: Secondary | ICD-10-CM | POA: Diagnosis not present

## 2022-04-28 DIAGNOSIS — M87052 Idiopathic aseptic necrosis of left femur: Secondary | ICD-10-CM | POA: Diagnosis not present

## 2022-04-28 DIAGNOSIS — Z79891 Long term (current) use of opiate analgesic: Secondary | ICD-10-CM | POA: Diagnosis not present

## 2022-04-28 DIAGNOSIS — I10 Essential (primary) hypertension: Secondary | ICD-10-CM | POA: Diagnosis not present

## 2022-04-28 DIAGNOSIS — K219 Gastro-esophageal reflux disease without esophagitis: Secondary | ICD-10-CM | POA: Diagnosis not present

## 2022-04-28 DIAGNOSIS — Z96641 Presence of right artificial hip joint: Secondary | ICD-10-CM | POA: Diagnosis not present

## 2022-04-28 DIAGNOSIS — Z7982 Long term (current) use of aspirin: Secondary | ICD-10-CM | POA: Diagnosis not present

## 2022-04-28 DIAGNOSIS — M150 Primary generalized (osteo)arthritis: Secondary | ICD-10-CM | POA: Diagnosis not present

## 2022-04-28 DIAGNOSIS — F419 Anxiety disorder, unspecified: Secondary | ICD-10-CM | POA: Diagnosis not present

## 2022-05-01 ENCOUNTER — Ambulatory Visit (INDEPENDENT_AMBULATORY_CARE_PROVIDER_SITE_OTHER): Payer: BC Managed Care – PPO | Admitting: Physician Assistant

## 2022-05-01 ENCOUNTER — Telehealth: Payer: Self-pay | Admitting: Medical

## 2022-05-01 ENCOUNTER — Encounter: Payer: Self-pay | Admitting: Physician Assistant

## 2022-05-01 DIAGNOSIS — Z96641 Presence of right artificial hip joint: Secondary | ICD-10-CM

## 2022-05-01 NOTE — Progress Notes (Signed)
                                                                                                                                                                                                                HPI: Mr. Christopher Andersen returns today status post right total hip arthroplasty 04/18/2022.  He is overall doing well.  He states he actually went without a cane or walker on his front porch yesterday.  He does come in today using a walker though.  He is taking oxycodone for pain.  He also is on aspirin 81 mg twice daily.  Review of systems see HPI otherwise negative   Physical exam: Right hip surgical incisions healing well no signs of infection.  Skin is well approximated staples.  Positive seroma.  20 cc of serosanguineous fluid aspirated patient tolerates well.  Right calf supple nontender dorsiflexion plantarflexion right ankle intact.  Impression: Status post right total hip arthroplasty  Plan: Staples removed Steri-Strips applied.  He will work on scar tissue mobilization.  We will continue to take an 81 mg aspirin once daily for another week and then discontinue as he was on no aspirin prior to surgery.  Follow-up with [place] in 4 weeks sooner if there is any questions concerns.                   ++

## 2022-05-01 NOTE — Telephone Encounter (Signed)
I ordered mri for this pt a while ago. I don't remember ever getting update on denied or authorized etc. Will you check on that and update me.  Thanks,

## 2022-05-03 DIAGNOSIS — Z79891 Long term (current) use of opiate analgesic: Secondary | ICD-10-CM | POA: Diagnosis not present

## 2022-05-03 DIAGNOSIS — F32A Depression, unspecified: Secondary | ICD-10-CM | POA: Diagnosis not present

## 2022-05-03 DIAGNOSIS — F419 Anxiety disorder, unspecified: Secondary | ICD-10-CM | POA: Diagnosis not present

## 2022-05-03 DIAGNOSIS — M150 Primary generalized (osteo)arthritis: Secondary | ICD-10-CM | POA: Diagnosis not present

## 2022-05-03 DIAGNOSIS — K219 Gastro-esophageal reflux disease without esophagitis: Secondary | ICD-10-CM | POA: Diagnosis not present

## 2022-05-03 DIAGNOSIS — Z471 Aftercare following joint replacement surgery: Secondary | ICD-10-CM | POA: Diagnosis not present

## 2022-05-03 DIAGNOSIS — Z7982 Long term (current) use of aspirin: Secondary | ICD-10-CM | POA: Diagnosis not present

## 2022-05-03 DIAGNOSIS — Z96641 Presence of right artificial hip joint: Secondary | ICD-10-CM | POA: Diagnosis not present

## 2022-05-03 DIAGNOSIS — M87052 Idiopathic aseptic necrosis of left femur: Secondary | ICD-10-CM | POA: Diagnosis not present

## 2022-05-03 DIAGNOSIS — I10 Essential (primary) hypertension: Secondary | ICD-10-CM | POA: Diagnosis not present

## 2022-05-03 NOTE — Telephone Encounter (Signed)
Pt called and lvm to return call 

## 2022-05-05 DIAGNOSIS — F419 Anxiety disorder, unspecified: Secondary | ICD-10-CM | POA: Diagnosis not present

## 2022-05-05 DIAGNOSIS — Z96641 Presence of right artificial hip joint: Secondary | ICD-10-CM | POA: Diagnosis not present

## 2022-05-05 DIAGNOSIS — Z79891 Long term (current) use of opiate analgesic: Secondary | ICD-10-CM | POA: Diagnosis not present

## 2022-05-05 DIAGNOSIS — I10 Essential (primary) hypertension: Secondary | ICD-10-CM | POA: Diagnosis not present

## 2022-05-05 DIAGNOSIS — Z471 Aftercare following joint replacement surgery: Secondary | ICD-10-CM | POA: Diagnosis not present

## 2022-05-05 DIAGNOSIS — Z7982 Long term (current) use of aspirin: Secondary | ICD-10-CM | POA: Diagnosis not present

## 2022-05-05 DIAGNOSIS — M150 Primary generalized (osteo)arthritis: Secondary | ICD-10-CM | POA: Diagnosis not present

## 2022-05-05 DIAGNOSIS — K219 Gastro-esophageal reflux disease without esophagitis: Secondary | ICD-10-CM | POA: Diagnosis not present

## 2022-05-05 DIAGNOSIS — F32A Depression, unspecified: Secondary | ICD-10-CM | POA: Diagnosis not present

## 2022-05-05 DIAGNOSIS — M87052 Idiopathic aseptic necrosis of left femur: Secondary | ICD-10-CM | POA: Diagnosis not present

## 2022-05-08 NOTE — Telephone Encounter (Signed)
Pt called and lvm to return call 

## 2022-05-09 ENCOUNTER — Other Ambulatory Visit: Payer: Self-pay | Admitting: Orthopaedic Surgery

## 2022-05-09 MED ORDER — OXYCODONE HCL 5 MG PO TABS: 5.0000 mg | ORAL_TABLET | Freq: Four times a day (QID) | ORAL | 0 refills | Status: DC | PRN

## 2022-05-11 ENCOUNTER — Telehealth: Payer: Self-pay | Admitting: Orthopaedic Surgery

## 2022-05-11 ENCOUNTER — Encounter: Payer: Self-pay | Admitting: Orthopaedic Surgery

## 2022-05-11 NOTE — Telephone Encounter (Signed)
Pt called requesting a letter for his employer with surgery date and type of surgery and estimated time off work. Pt states his employer sent requesting thru fax and no response. Please send letter to pt mychart. Pt phone number is [phone].

## 2022-05-15 ENCOUNTER — Other Ambulatory Visit: Payer: Self-pay | Admitting: Physician Assistant

## 2022-05-16 NOTE — Telephone Encounter (Signed)
Please advise 

## 2022-05-17 ENCOUNTER — Encounter: Payer: Self-pay | Admitting: Orthopaedic Surgery

## 2022-05-26 ENCOUNTER — Telehealth: Payer: Self-pay | Admitting: Medical

## 2022-05-26 ENCOUNTER — Ambulatory Visit (HOSPITAL_BASED_OUTPATIENT_CLINIC_OR_DEPARTMENT_OTHER)
Admission: RE | Admit: 2022-05-26 | Discharge: 2022-05-26 | Disposition: A | Discharge status: Home / Self Care | Payer: BC Managed Care – PPO | Source: Ambulatory Visit | Attending: Medical | Admitting: Medical

## 2022-05-26 ENCOUNTER — Ambulatory Visit (INDEPENDENT_AMBULATORY_CARE_PROVIDER_SITE_OTHER): Payer: BC Managed Care – PPO | Admitting: Medical

## 2022-05-26 VITALS — BP 117/80 | HR 97 | Resp 18 | Ht 72.0 in | Wt 201.0 lb

## 2022-05-26 DIAGNOSIS — M25552 Pain in left hip: Secondary | ICD-10-CM | POA: Diagnosis not present

## 2022-05-26 DIAGNOSIS — K5909 Other constipation: Secondary | ICD-10-CM | POA: Diagnosis not present

## 2022-05-26 DIAGNOSIS — D179 Benign lipomatous neoplasm, unspecified: Secondary | ICD-10-CM | POA: Diagnosis not present

## 2022-05-26 DIAGNOSIS — K5900 Constipation, unspecified: Secondary | ICD-10-CM | POA: Diagnosis not present

## 2022-05-26 NOTE — Progress Notes (Signed)
Subjective:    Patient ID: Christopher Andersen, male    DOB: Jan 20, 1971, 51 y.o.   MRN: [id number]  HPI  Pt in for recent constipation.   Pt had surgery on April 18, 2022. Pt states he was constipated immediatley after his surgery. He was using   Pt was using miralax for 3 weeks one cup a day. He was also using 3 colace a day. Pt has been on oxycodone 5 mg. Just recently on one a day for 2 weeks. After surgery he was on 2 tab every 6 hours.    He takes oxycodone only when pain level gets to 6-7/10  Pt does take robaxin. Taking med 3 times a day.  Pt is currently hurting on on left side where had  avascular necrosis.   Had bowel movement this morning. Soft with little loose stool.  Last week was very watery. Never had large bowel movement after being watery.  He feels like he always needs to go more.  In past was on nsaid in the past and it seemed to help a lot.    Review of Systems  Constitutional:  Negative for chills, fatigue and fever.  Respiratory:  Negative for chest tightness, shortness of breath and wheezing.   Cardiovascular:  Negative for chest pain and palpitations.  Gastrointestinal:  Negative for abdominal pain, blood in stool and constipation.  Musculoskeletal:  Negative for back pain and joint swelling.  Neurological:  Negative for dizziness, speech difficulty, weakness and headaches.  Hematological:  Negative for adenopathy. Does not bruise/bleed easily.  Psychiatric/Behavioral:  Negative for behavioral problems.     Past Medical History:  Diagnosis Date   Anxiety    Arthritis    shoulder and neck   Asthma    as a child   Depression    GERD (gastroesophageal reflux disease)    occasional reflux. treat with OTC meds   Hypertension    Lipoma    removed from right face and current left inguinal   Pneumonia    few times as a child     Social History   Socioeconomic History   Marital status: Divorced    Spouse name: Not on file   Number of  children: 8   Years of education: Not on file   Highest education level: Not on file  Occupational History   Occupation: [employer]  Tobacco Use   Smoking status: Former    Types: Cigarettes   Smokeless tobacco: Former  Scientific laboratory technician Use: Never used  Substance and Sexual Activity   Alcohol use: Yes    Comment: 3-5 beers daily.   Drug use: No   Sexual activity: Yes  Other Topics Concern   Not on file  Social History Narrative   Not on file   Social Determinants of Health   Financial Resource Strain: Not on file  Food Insecurity: Not on file  Transportation Needs: Not on file  Physical Activity: Not on file  Stress: Not on file  Social Connections: Not on file  Intimate Partner Violence: Not on file    Past Surgical History:  Procedure Laterality Date   LIPOMA EXCISION  2005   face   TONSILLECTOMY     TOTAL HIP ARTHROPLASTY Right 04/18/2022   Procedure: RIGHT TOTAL HIP ARTHROPLASTY ANTERIOR APPROACH;  Surgeon: Mcarthur Rossetti, MD;  Location: [facility];  Service: Orthopedics;  Laterality: Right;    Family History  Problem Relation Age of Onset  Dementia Mother    Hypertension Mother    Ulcers Mother    Diabetes Father    Non-Hodgkin's lymphoma Father    Colon cancer Neg Hx    Stomach cancer Neg Hx    Esophageal cancer Neg Hx     No Known Allergies  Current Outpatient Medications on File Prior to Visit  Medication Sig Dispense Refill   losartan (COZAAR) 50 MG tablet TAKE 1 TABLET BY MOUTH EVERY DAY 90 tablet 1   methocarbamol (ROBAXIN) 500 MG tablet TAKE 1 TABLET BY MOUTH EVERY 6 HOURS AS NEEDED FOR MUSCLE SPASMS. 40 tablet 1   oxyCODONE (OXY IR/ROXICODONE) 5 MG immediate release tablet Take 1-2 tablets (5-10 mg total) by mouth every 6 (six) hours as needed for moderate pain (pain score 4-6). 30 tablet 0   Current Facility-Administered Medications on File Prior to Visit  Medication Dose Route Frequency Provider Last Rate Last Admin    0.9 %  sodium chloride infusion  500 mL Intravenous Once [name], MD        BP 117/80   Pulse 97   Resp 18   Ht 6' (1.829 m)   Wt 201 lb (91.2 kg)   SpO2 99%   BMI 27.26 kg/m        Objective:   Physical Exam  General- No acute distress. Pleasant patient. Neck- Full range of motion, no jvd Lungs- Clear, even and unlabored. Heart- regular rate and rhythm. Neurologic- CNII- XII grossly intact.  Abdomen- soft, nt, nd, +bs, no rebound or guarding. Left hip- pain on range of motion. Medial to left hp. I feel small 2 cm oval soft lump. Better felt today than on other visit. Feels like lipoma.       Assessment & Plan:   Patient Instructions  Persistent moderate to severe left hip pain daily basis.  Known avascular necrosis and glad to hear that after surgery right-sided hip pain no longer present.  On discussion you reported in the past that NSAIDs seem to help a lot and he reported that Celebrex was one that you had used in the past.  That was stopped around the time of your surgery when you were using aspirin.  On review no hypertension, good kidney function and no history of GI bleed/ulcer.    So I think it is reasonable to potentially prescribe Celebrex 100 to 200 mg daily dose and use that in combination with Tylenol.  We can assess your response to and then possibly add on low-dose Norco possibly 1 tablet a day. Want you to get ortho opinion first.  With less potent narcotic I think you will have better bowel movements.  Regarding constipation intermittently since her surgery I do think we will get 1 view abdomen x-ray today to assess stool burden.  If significant stool burden might advise 1 dose treatment of magnesium citrate.  Recommend eating healthy diet, hydrate well and try to get some daily exercise.  If you do have symptoms significant loose watery stools let me know and we will get C. difficile studies.  Currently I do not think that indicated.  Follow-up  in 3 weeks or sooner if needed.  We discussed briefly your left lower quadrant small lump/mass.  On today's exam I do think it feels more lipoma-like that I did in the past.  Going to to talk with referral coordinator as to what came of prior MRI order that I placed.   Time spent with patient today was  41 minutes which consisted of chart revdiew, discussing diagnosis, work up treatment and documentation.

## 2022-05-26 NOTE — Telephone Encounter (Signed)
Gwenn,  I sent pt message today about his mri being prior authorized  in June and was about to explain that there were notes about him being prior authorized for the mri abd/pelvis for one month and that we notified him and he was sheduled.   Would you mind looking/rechecking. Make sure the study was mri and notes are present to back up. Just wanted to make sure of this as his account is different.   Also I can see that the denied initial prior authorization. I was asking staff to help me contact person to have peer to peer but that never occurred?  When I was talking to pt today I thought I remembered authorization went thru but I just can't see that on review tonight.  Thanks, Percell Miller

## 2022-05-26 NOTE — Patient Instructions (Addendum)
Persistent moderate to severe left hip pain daily basis.  Known avascular necrosis and glad to hear that after surgery right-sided hip pain no longer present.  On discussion you reported in the past that NSAIDs seem to help a lot and he reported that Celebrex was one that you had used in the past.  That was stopped around the time of your surgery when you were using aspirin.  On review no hypertension, good kidney function and no history of GI bleed/ulcer.    So I think it is reasonable to potentially prescribe Celebrex 100 to 200 mg daily dose and use that in combination with Tylenol.  We can assess your response to and then possibly add on low-dose Norco possibly 1 tablet a day. Want you to get ortho opinion first.  With less potent narcotic I think you will have better bowel movements.  Regarding constipation intermittently since her surgery I do think we will get 1 view abdomen x-ray today to assess stool burden.  If significant stool burden might advise 1 dose treatment of magnesium citrate.  Recommend eating healthy diet, hydrate well and try to get some daily exercise.  If you do have symptoms significant loose watery stools let me know and we will get C. difficile studies.  Currently I do not think that indicated.  Follow-up in 3 weeks or sooner if needed.  We discussed briefly your left lower quadrant small lump/mass.  On today's exam I do think it feels more lipoma-like that I did in the past.  Going to to talk with referral coordinator as to what came of prior MRI order that I placed.

## 2022-05-29 ENCOUNTER — Encounter: Payer: Self-pay | Admitting: Physician Assistant

## 2022-05-29 ENCOUNTER — Ambulatory Visit (INDEPENDENT_AMBULATORY_CARE_PROVIDER_SITE_OTHER): Payer: BC Managed Care – PPO | Admitting: Physician Assistant

## 2022-05-29 DIAGNOSIS — M87052 Idiopathic aseptic necrosis of left femur: Secondary | ICD-10-CM

## 2022-05-29 DIAGNOSIS — Z96641 Presence of right artificial hip joint: Secondary | ICD-10-CM

## 2022-05-29 MED ORDER — TRAMADOL HCL 50 MG PO TABS: 50.0000 mg | ORAL_TABLET | Freq: Four times a day (QID) | ORAL | 0 refills | Status: DC | PRN

## 2022-05-29 MED ORDER — CELECOXIB 100 MG PO CAPS: ORAL_CAPSULE | ORAL | 0 refills | Status: DC

## 2022-05-29 NOTE — Progress Notes (Signed)
  HPI: Christopher Andersen returns today status post right total hip arthroplasty 04/18/2022.  He states overall his right hip is doing well.  He is having some low back pain and left hip pain.  Denies any fevers chills.  He is using no assistive device.  He has been taking oxycodone at night for pain and Robaxin.  He is wanting to return to work on August 7 with the only restriction is needing periodic time off due to hip pain.  Physical exam: General well-developed well-nourished male in no acute distress ambulates with a nonantalgic gait and uses no assistive device.  Slight leg length discrepancy with the right leg being longer than the left.  Impression: Status post right total hip arthroplasty 04/18/2022. Left hip avascular necrosis   Plan: We will stop his oxycodone place him on tramadol for pain.  He can go back on his Celebrex.  He is given a work note to return to work June 05, 2022 with the only work restriction is needing periodic time off due to hip pain.  He will follow-up with [place] in mid September and at that point time hopefully we will have him set up for a left total hip arthroplasty.  He is requesting to perform this sometime in late October.  Questions were encouraged and answered at length today.

## 2022-05-29 NOTE — Addendum Note (Signed)
Addended by: Anabel Halon on: 05/29/2022 05:40 PM   Modules accepted: Orders

## 2022-05-31 ENCOUNTER — Telehealth: Payer: Self-pay | Admitting: Orthopaedic Surgery

## 2022-05-31 NOTE — Telephone Encounter (Signed)
Pt submitted continued FMLA to Intermitted FMLA. And medical release. If any questions call pt at [phone].

## 2022-06-06 NOTE — Telephone Encounter (Signed)
Spoke with insurance and MRI is approved from 05/31/22-06/29/22.  Message sent to referrals.

## 2022-06-14 ENCOUNTER — Encounter: Payer: Self-pay | Admitting: Medical

## 2022-06-14 DIAGNOSIS — L570 Actinic keratosis: Secondary | ICD-10-CM | POA: Diagnosis not present

## 2022-06-16 ENCOUNTER — Other Ambulatory Visit: Payer: Self-pay | Admitting: Orthopaedic Surgery

## 2022-06-16 MED ORDER — TRAMADOL HCL 50 MG PO TABS: 50.0000 mg | ORAL_TABLET | Freq: Four times a day (QID) | ORAL | 0 refills | Status: DC | PRN

## 2022-06-19 ENCOUNTER — Encounter: Payer: Self-pay | Admitting: Medical

## 2022-06-19 NOTE — Telephone Encounter (Signed)
Can you help with this?

## 2022-06-29 ENCOUNTER — Other Ambulatory Visit: Payer: Self-pay

## 2022-06-29 DIAGNOSIS — R1900 Intra-abdominal and pelvic swelling, mass and lump, unspecified site: Secondary | ICD-10-CM

## 2022-07-02 ENCOUNTER — Other Ambulatory Visit: Payer: Self-pay | Admitting: Orthopaedic Surgery

## 2022-07-04 ENCOUNTER — Ambulatory Visit (HOSPITAL_COMMUNITY): Admission: RE | Admit: 2022-07-04 | Payer: BC Managed Care – PPO | Source: Ambulatory Visit

## 2022-07-04 MED ORDER — TRAMADOL HCL 50 MG PO TABS: 50.0000 mg | ORAL_TABLET | Freq: Four times a day (QID) | ORAL | 0 refills | Status: DC | PRN

## 2022-07-05 ENCOUNTER — Ambulatory Visit (INDEPENDENT_AMBULATORY_CARE_PROVIDER_SITE_OTHER): Payer: BC Managed Care – PPO | Admitting: Orthopaedic Surgery

## 2022-07-05 DIAGNOSIS — Z96641 Presence of right artificial hip joint: Secondary | ICD-10-CM

## 2022-07-05 DIAGNOSIS — M87052 Idiopathic aseptic necrosis of left femur: Secondary | ICD-10-CM

## 2022-07-05 NOTE — Progress Notes (Signed)
    HPI: Christopher Andersen comes in today for follow-up of his right total hip arthroplasty which was performed on 04/18/2022.  He states the right hip is doing great he has no pain in the hip.  He is wanting to schedule left total hip arthroplasty and tentatively is scheduled for 08/29/2022.  He states he is having increasing pain in the left hip states he has 7 out of 10 pain that is constant in the left hip and 9-10 on the pain worse.  He has a locking sensation in the hip.  He also notes that his right leg is longer than his left since undergoing total hip arthroplasty.  He has had no acute injury to the left or right hip.  He notes that the Celebrex gave him no relief with his hip pain.  He is currently taking tramadol for the hip pain.  Review of systems: See HPI otherwise negative  Physical exam: General well-developed well-nourished male who walks with an antalgic gait without any assistive device. Right hip good range of motion without pain.  Left hip limited range of motion secondary to pain.  Bilateral calf supple nontender dorsiflexion plantarflexion bilateral ankles intact.  Impression: Status post right total hip arthroplasty 04/18/2022 Left hip avascular necrosis  Plan: Discussed with him left total hip arthroplasty understands the risk benefits of surgery having just undergone a right total hip arthroplasty.  Questions were encouraged and answered at length.  We will see him back 2 weeks postop.

## 2022-07-10 ENCOUNTER — Other Ambulatory Visit: Payer: Self-pay | Admitting: Orthopaedic Surgery

## 2022-07-10 MED ORDER — TRAMADOL HCL 50 MG PO TABS: 50.0000 mg | ORAL_TABLET | Freq: Four times a day (QID) | ORAL | 0 refills | Status: DC | PRN

## 2022-07-11 ENCOUNTER — Ambulatory Visit (HOSPITAL_COMMUNITY)
Admission: RE | Admit: 2022-07-11 | Discharge: 2022-07-11 | Disposition: A | Discharge status: Home / Self Care | Payer: BC Managed Care – PPO | Source: Ambulatory Visit | Attending: Medical | Admitting: Medical

## 2022-07-11 DIAGNOSIS — R1900 Intra-abdominal and pelvic swelling, mass and lump, unspecified site: Secondary | ICD-10-CM | POA: Insufficient documentation

## 2022-07-11 DIAGNOSIS — R600 Localized edema: Secondary | ICD-10-CM | POA: Diagnosis not present

## 2022-07-11 DIAGNOSIS — M87852 Other osteonecrosis, left femur: Secondary | ICD-10-CM | POA: Diagnosis not present

## 2022-07-11 DIAGNOSIS — M65852 Other synovitis and tenosynovitis, left thigh: Secondary | ICD-10-CM | POA: Diagnosis not present

## 2022-07-11 DIAGNOSIS — M25452 Effusion, left hip: Secondary | ICD-10-CM | POA: Diagnosis not present

## 2022-07-11 MED ORDER — GADOBUTROL 1 MMOL/ML IV SOLN
9.0000 mL | Freq: Once | INTRAVENOUS | Status: AC | PRN
Start: 2022-07-11 — End: 2022-07-11
  Administered 2022-07-11: 9 mL via INTRAVENOUS

## 2022-07-13 ENCOUNTER — Encounter: Payer: Self-pay | Admitting: Medical

## 2022-07-13 ENCOUNTER — Encounter: Payer: Self-pay | Admitting: Orthopaedic Surgery

## 2022-07-22 ENCOUNTER — Encounter: Payer: Self-pay | Admitting: Medical

## 2022-07-26 ENCOUNTER — Other Ambulatory Visit: Payer: Self-pay | Admitting: Orthopaedic Surgery

## 2022-07-26 MED ORDER — TRAMADOL HCL 50 MG PO TABS: 50.0000 mg | ORAL_TABLET | Freq: Four times a day (QID) | ORAL | 0 refills | Status: DC | PRN

## 2022-08-04 ENCOUNTER — Other Ambulatory Visit: Payer: Self-pay | Admitting: Orthopaedic Surgery

## 2022-08-04 MED ORDER — TRAMADOL HCL 50 MG PO TABS: 50.0000 mg | ORAL_TABLET | Freq: Four times a day (QID) | ORAL | 0 refills | Status: DC | PRN

## 2022-08-09 ENCOUNTER — Other Ambulatory Visit: Payer: Self-pay

## 2022-08-17 ENCOUNTER — Other Ambulatory Visit: Payer: Self-pay | Admitting: Orthopaedic Surgery

## 2022-08-17 ENCOUNTER — Other Ambulatory Visit: Payer: Self-pay | Admitting: Physician Assistant

## 2022-08-17 MED ORDER — TRAMADOL HCL 50 MG PO TABS: 50.0000 mg | ORAL_TABLET | Freq: Four times a day (QID) | ORAL | 0 refills | Status: DC | PRN

## 2022-08-17 NOTE — Pre-Procedure Instructions (Signed)
Surgical Instructions    Your procedure is scheduled on Tuesday, October 31.  Report to [facility] Main Entrance "A" at 5:30 A.M., then check in with the Admitting office.  Call this number if you have problems the morning of surgery:  [phone]   If you have any questions prior to your surgery date call [phone]: Open Monday-Friday 8am-4pm If you experience any cold or flu symptoms such as cough, fever, chills, shortness of breath, etc. between now and your scheduled surgery, please notify us at the above number     Remember:  Do not eat after midnight the night before your surgery  You may drink clear liquids until 4:30AM the morning of your surgery.   Clear liquids allowed are: Water, Non-Citrus Juices (without pulp), Carbonated Beverages, Clear Tea, Black Coffee ONLY (NO MILK, CREAM OR POWDERED CREAMER of any kind), and Gatorade    Take these medicines the morning of surgery with A SIP OF WATER:  traMADol (ULTRAM)  if needed cyclobenzaprine (FLEXERIL)  if needed   As of today, STOP taking any Aspirin (unless otherwise instructed by your surgeon) Aleve, Naproxen, Ibuprofen, Motrin, Advil, Goody's, BC's, all herbal medications, fish oil, and all vitamins.            [facility] is not responsible for any belongings or valuables.    Do NOT Smoke (Tobacco/Vaping)  24 hours prior to your procedure  If you use a CPAP at night, you may bring your mask for your overnight stay.   Contacts, glasses, hearing aids, dentures or partials may not be worn into surgery, please bring cases for these belongings   For patients admitted to the hospital, discharge time will be determined by your treatment team.   Patients discharged the day of surgery will not be allowed to drive home, and someone needs to stay with them for 24 hours.   SURGICAL WAITING ROOM VISITATION Patients having surgery or a procedure may have no more than 2 support people in the waiting area - these visitors may  rotate.   Children under the age of 70 must have an adult with them who is not the patient. If the patient needs to stay at the hospital during part of their recovery, the visitor guidelines for inpatient rooms apply. Pre-op nurse will coordinate an appropriate time for 1 support person to accompany patient in pre-op.  This support person may not rotate.   Please refer to [email or web address] for the visitor guidelines for Inpatients (after your surgery is over and you are in a regular room).    Special instructions:    Oral Hygiene is also important to reduce your risk of infection.  Remember - BRUSH YOUR TEETH THE MORNING OF SURGERY WITH YOUR REGULAR TOOTHPASTE   [facility]- Preparing For Surgery  Before surgery, you can play an important role. Because skin is not sterile, your skin needs to be as free of germs as possible. You can reduce the number of germs on your skin by washing with CHG (chlorahexidine gluconate) Soap before surgery.  CHG is an antiseptic cleaner which kills germs and bonds with the skin to continue killing germs even after washing.     Please do not use if you have an allergy to CHG or antibacterial soaps. If your skin becomes reddened/irritated stop using the CHG.  Do not shave (including legs and underarms) for at least 48 hours prior to first CHG shower. It is OK to shave your face.  Please follow these instructions  carefully.     Shower the NIGHT BEFORE SURGERY and the MORNING OF SURGERY with CHG Soap.   If you chose to wash your hair, wash your hair first as usual with your normal shampoo. After you shampoo, rinse your hair and body thoroughly to remove the shampoo.  Then [employer] and genitals (private parts) with your normal soap and rinse thoroughly to remove soap.  After that Use CHG Soap as you would any other liquid soap. You can apply CHG directly to the skin and wash gently with a scrungie or a clean  washcloth.   Apply the CHG Soap to your body ONLY FROM THE NECK DOWN.  Do not use on open wounds or open sores. Avoid contact with your eyes, ears, mouth and genitals (private parts). Wash Face and genitals (private parts)  with your normal soap.   Wash thoroughly, paying special attention to the area where your surgery will be performed.  Thoroughly rinse your body with warm water from the neck down.  DO NOT shower/wash with your normal soap after using and rinsing off the CHG Soap.  Pat yourself dry with a CLEAN TOWEL.  Wear CLEAN PAJAMAS to bed the night before surgery  Place CLEAN SHEETS on your bed the night before your surgery  DO NOT SLEEP WITH PETS.   Day of Surgery:  Take a shower with CHG soap. Wear Clean/Comfortable clothing the morning of surgery Do not wear jewelry or makeup. Do not wear lotions, powders, perfumes/cologne or deodorant. Do not shave 48 hours prior to surgery.  Men may shave face and neck. Do not bring valuables to the hospital. Do not wear nail polish, gel polish, artificial nails, or any other type of covering on natural nails (fingers and toes) If you have artificial nails or gel coating that need to be removed by a nail salon, please have this removed prior to surgery. Artificial nails or gel coating may interfere with anesthesia's ability to adequately monitor your vital signs. Remember to brush your teeth WITH YOUR REGULAR TOOTHPASTE.    If you received a COVID test during your pre-op visit, it is requested that you wear a mask when out in public, stay away from anyone that may not be feeling well, and notify your surgeon if you develop symptoms. If you have been in contact with anyone that has tested positive in the last 10 days, please notify your surgeon.    Please read over the following fact sheets that you were given.

## 2022-08-18 ENCOUNTER — Encounter (HOSPITAL_COMMUNITY)
Admission: RE | Admit: 2022-08-18 | Discharge: 2022-08-18 | Disposition: A | Discharge status: Home / Self Care | Payer: BC Managed Care – PPO | Source: Ambulatory Visit | Attending: Orthopaedic Surgery | Admitting: Orthopaedic Surgery

## 2022-08-18 ENCOUNTER — Encounter (HOSPITAL_COMMUNITY): Payer: Self-pay

## 2022-08-18 ENCOUNTER — Other Ambulatory Visit: Payer: Self-pay

## 2022-08-18 VITALS — BP 126/89 | HR 80 | Temp 98.0°F | Resp 17 | Ht 71.0 in | Wt 197.8 lb

## 2022-08-18 DIAGNOSIS — Z01812 Encounter for preprocedural laboratory examination: Secondary | ICD-10-CM | POA: Insufficient documentation

## 2022-08-18 DIAGNOSIS — Z01818 Encounter for other preprocedural examination: Secondary | ICD-10-CM

## 2022-08-18 LAB — BASIC METABOLIC PANEL
Anion gap: 10 (ref 5–15)
BUN: 12 mg/dL (ref 6–20)
CO2: 24 mmol/L (ref 22–32)
Calcium: 9.4 mg/dL (ref 8.9–10.3)
Chloride: 103 mmol/L (ref 98–111)
Creatinine, Ser: 1.18 mg/dL (ref 0.61–1.24)
GFR, Estimated: >60 mL/min (ref >60)
Glucose, Bld: 101 mg/dL — ABNORMAL HIGH (ref 70–99)
Potassium: 4.1 mmol/L (ref 3.5–5.1)
Sodium: 137 mmol/L (ref 135–145)

## 2022-08-18 LAB — CBC
HCT: 47.9 % (ref 39.0–52.0)
Hemoglobin: 15.7 g/dL (ref 13.0–17.0)
MCH: 30.4 pg (ref 26.0–34.0)
MCHC: 32.8 g/dL (ref 30.0–36.0)
MCV: 92.8 fL (ref 80.0–100.0)
Platelets: 210 10*3/uL (ref 150–400)
RBC: 5.16 MIL/uL (ref 4.22–5.81)
RDW: 12.8 % (ref 11.5–15.5)
WBC: 6.1 10*3/uL (ref 4.0–10.5)
nRBC: 0 % (ref 0.0–0.2)

## 2022-08-18 LAB — TYPE AND SCREEN
ABO/RH(D): O POS
Antibody Screen: NEGATIVE

## 2022-08-18 LAB — SURGICAL PCR SCREEN

## 2022-08-18 NOTE — Pre-Procedure Instructions (Signed)
Surgical Instructions    Your procedure is scheduled on Tuesday, October 31.  Report to [facility] Main Entrance "A" at 5:30 A.M., then check in with the Admitting office.  Call this number if you have problems the morning of surgery:  [phone]   If you have any questions prior to your surgery date call [phone]: Open Monday-Friday 8am-4pm If you experience any cold or flu symptoms such as cough, fever, chills, shortness of breath, etc. between now and your scheduled surgery, please notify us at the above number     Remember:  Do not eat after midnight the night before your surgery  You may drink clear liquids until 4:30AM the morning of your surgery.   Clear liquids allowed are: Water, Non-Citrus Juices (without pulp), Carbonated Beverages, Clear Tea, Black Coffee ONLY (NO MILK, CREAM OR POWDERED CREAMER of any kind), and Gatorade  Patient Instructions  The night before surgery:  No food after midnight. ONLY clear liquids after midnight  The day of surgery (if you do NOT have diabetes):  Drink ONE (1) Pre-Surgery Clear Ensure by 4:30am the morning of surgery. Drink in one sitting. Do not sip.  This drink was given to you during your hospital  pre-op appointment visit. Nothing else to drink after completing the  Pre-Surgery Clear Ensure.           If you have questions, please contact your surgeon's office. '    Take these medicines the morning of surgery with A SIP OF WATER:  traMADol (ULTRAM)  if needed cyclobenzaprine (FLEXERIL)  if needed   As of today, STOP taking any Aspirin (unless otherwise instructed by your surgeon) Aleve, Naproxen, Ibuprofen, Motrin, Advil, Goody's, BC's, all herbal medications, fish oil, and all vitamins.            [facility] is not responsible for any belongings or valuables.    Do NOT Smoke (Tobacco/Vaping)  24 hours prior to your procedure  If you use a CPAP at night, you may bring your mask for your overnight stay.    Contacts, glasses, hearing aids, dentures or partials may not be worn into surgery, please bring cases for these belongings   For patients admitted to the hospital, discharge time will be determined by your treatment team.   Patients discharged the day of surgery will not be allowed to drive home, and someone needs to stay with them for 24 hours.   SURGICAL WAITING ROOM VISITATION Patients having surgery or a procedure may have no more than 2 support people in the waiting area - these visitors may rotate.   Children under the age of 46 must have an adult with them who is not the patient. If the patient needs to stay at the hospital during part of their recovery, the visitor guidelines for inpatient rooms apply. Pre-op nurse will coordinate an appropriate time for 1 support person to accompany patient in pre-op.  This support person may not rotate.   Please refer to [email or web address] for the visitor guidelines for Inpatients (after your surgery is over and you are in a regular room).    Special instructions:    Oral Hygiene is also important to reduce your risk of infection.  Remember - BRUSH YOUR TEETH THE MORNING OF SURGERY WITH YOUR REGULAR TOOTHPASTE   - Preparing For Surgery  Before surgery, you can play an important role. Because skin is not sterile, your skin needs to be as free of germs as possible. You can reduce  the number of germs on your skin by washing with CHG (chlorahexidine gluconate) Soap before surgery.  CHG is an antiseptic cleaner which kills germs and bonds with the skin to continue killing germs even after washing.     Please do not use if you have an allergy to CHG or antibacterial soaps. If your skin becomes reddened/irritated stop using the CHG.  Do not shave (including legs and underarms) for at least 48 hours prior to first CHG shower. It is OK to shave your face.  Please follow these instructions  carefully.     Shower the NIGHT BEFORE SURGERY and the MORNING OF SURGERY with CHG Soap.   If you chose to wash your hair, wash your hair first as usual with your normal shampoo. After you shampoo, rinse your hair and body thoroughly to remove the shampoo.  Then [employer] and genitals (private parts) with your normal soap and rinse thoroughly to remove soap.  After that Use CHG Soap as you would any other liquid soap. You can apply CHG directly to the skin and wash gently with a scrungie or a clean washcloth.   Apply the CHG Soap to your body ONLY FROM THE NECK DOWN.  Do not use on open wounds or open sores. Avoid contact with your eyes, ears, mouth and genitals (private parts). Wash Face and genitals (private parts)  with your normal soap.   Wash thoroughly, paying special attention to the area where your surgery will be performed.  Thoroughly rinse your body with warm water from the neck down.  DO NOT shower/wash with your normal soap after using and rinsing off the CHG Soap.  Pat yourself dry with a CLEAN TOWEL.  Wear CLEAN PAJAMAS to bed the night before surgery  Place CLEAN SHEETS on your bed the night before your surgery  DO NOT SLEEP WITH PETS.   Day of Surgery: Take a shower with CHG soap. Wear Clean/Comfortable clothing the morning of surgery Do not wear jewelry or makeup. Do not wear lotions, powders, perfumes/cologne or deodorant. Do not shave 48 hours prior to surgery.  Men may shave face and neck. Do not bring valuables to the hospital. Do not wear nail polish, gel polish, artificial nails, or any other type of covering on natural nails (fingers and toes) If you have artificial nails or gel coating that need to be removed by a nail salon, please have this removed prior to surgery. Artificial nails or gel coating may interfere with anesthesia's ability to adequately monitor your vital signs. Remember to brush your teeth WITH YOUR REGULAR TOOTHPASTE.    If you  received a COVID test during your pre-op visit, it is requested that you wear a mask when out in public, stay away from anyone that may not be feeling well, and notify your surgeon if you develop symptoms. If you have been in contact with anyone that has tested positive in the last 10 days, please notify your surgeon.    Please read over the following fact sheets that you were given.

## 2022-08-18 NOTE — Progress Notes (Signed)
PCP - Mackie Pai Cardiologist - denies  PPM/ICD - denies   Chest x-ray - n/a EKG - 04/07/22 Stress Test - denies ECHO - denies Cardiac Cath - denies  Sleep Study - denies   Follow your surgeon's instructions on when to stop Aspirin.  If no instructions were given by your surgeon then you will need to call the office to get those instructions.     ERAS Protcol -yes PRE-SURGERY Ensure or G2- ensure ordered and given  COVID TEST- not needed   Anesthesia review: no  Patient denies shortness of breath, fever, cough and chest pain at PAT appointment   All instructions explained to the patient, with a verbal understanding of the material. Patient agrees to go over the instructions while at home for a better understanding. Patient also instructed to self quarantine after being tested for COVID-19. The opportunity to ask questions was provided.

## 2022-08-21 NOTE — Progress Notes (Signed)
Surgical PCR invalid at PAT appointment.  Will need to repeat DOS.

## 2022-08-27 ENCOUNTER — Other Ambulatory Visit: Payer: Self-pay | Admitting: Medical

## 2022-08-29 ENCOUNTER — Ambulatory Visit (HOSPITAL_COMMUNITY): Payer: BC Managed Care – PPO | Admitting: Certified Registered Nurse Anesthetist

## 2022-08-29 ENCOUNTER — Observation Stay (HOSPITAL_COMMUNITY): Payer: BC Managed Care – PPO

## 2022-08-29 ENCOUNTER — Observation Stay (HOSPITAL_COMMUNITY)
Admission: RE | Admit: 2022-08-29 | Discharge: 2022-08-30 | Disposition: A | Discharge status: Home / Self Care | Payer: BC Managed Care – PPO | Attending: Orthopaedic Surgery | Admitting: Orthopaedic Surgery

## 2022-08-29 ENCOUNTER — Other Ambulatory Visit: Payer: Self-pay

## 2022-08-29 ENCOUNTER — Encounter (HOSPITAL_COMMUNITY): Payer: Self-pay | Admitting: Orthopaedic Surgery

## 2022-08-29 ENCOUNTER — Encounter (HOSPITAL_COMMUNITY)
Admission: RE | Disposition: A | Discharge status: Home / Self Care | Payer: Self-pay | Source: Home / Self Care | Attending: Orthopaedic Surgery

## 2022-08-29 ENCOUNTER — Ambulatory Visit (HOSPITAL_COMMUNITY): Payer: BC Managed Care – PPO

## 2022-08-29 DIAGNOSIS — M87052 Idiopathic aseptic necrosis of left femur: Secondary | ICD-10-CM | POA: Diagnosis not present

## 2022-08-29 DIAGNOSIS — J45909 Unspecified asthma, uncomplicated: Secondary | ICD-10-CM | POA: Insufficient documentation

## 2022-08-29 DIAGNOSIS — I10 Essential (primary) hypertension: Secondary | ICD-10-CM | POA: Insufficient documentation

## 2022-08-29 DIAGNOSIS — Z96642 Presence of left artificial hip joint: Secondary | ICD-10-CM

## 2022-08-29 DIAGNOSIS — M879 Osteonecrosis, unspecified: Secondary | ICD-10-CM | POA: Diagnosis not present

## 2022-08-29 DIAGNOSIS — M87852 Other osteonecrosis, left femur: Secondary | ICD-10-CM | POA: Diagnosis not present

## 2022-08-29 DIAGNOSIS — Z471 Aftercare following joint replacement surgery: Secondary | ICD-10-CM | POA: Diagnosis not present

## 2022-08-29 DIAGNOSIS — Z96641 Presence of right artificial hip joint: Secondary | ICD-10-CM | POA: Diagnosis not present

## 2022-08-29 DIAGNOSIS — Z87891 Personal history of nicotine dependence: Secondary | ICD-10-CM | POA: Insufficient documentation

## 2022-08-29 DIAGNOSIS — Z01818 Encounter for other preprocedural examination: Secondary | ICD-10-CM

## 2022-08-29 HISTORY — PX: TOTAL HIP ARTHROPLASTY: SHX124

## 2022-08-29 LAB — SURGICAL PCR SCREEN
MRSA, PCR: NEGATIVE
Staphylococcus aureus: NEGATIVE

## 2022-08-29 SURGERY — ARTHROPLASTY, HIP, TOTAL, ANTERIOR APPROACH
Anesthesia: Monitor Anesthesia Care | Site: Hip | Laterality: Left

## 2022-08-29 MED ORDER — TRANEXAMIC ACID-NACL 1000-0.7 MG/100ML-% IV SOLN
1000.0000 mg | INTRAVENOUS | Status: AC
  Administered 2022-08-29: 1000 mg via INTRAVENOUS

## 2022-08-29 MED ORDER — KETOROLAC TROMETHAMINE 15 MG/ML IJ SOLN
7.5000 mg | Freq: Four times a day (QID) | INTRAMUSCULAR | Status: AC
  Administered 2022-08-29 – 2022-08-30 (×4): 7.5 mg via INTRAVENOUS
  Filled 2022-08-29 (×3): qty 1

## 2022-08-29 MED ORDER — SODIUM CHLORIDE 0.9 % IV SOLN: INTRAVENOUS | Status: DC

## 2022-08-29 MED ORDER — METHOCARBAMOL 500 MG PO TABS
500.0000 mg | ORAL_TABLET | Freq: Four times a day (QID) | ORAL | Status: DC | PRN
  Administered 2022-08-29 – 2022-08-30 (×4): 500 mg via ORAL
  Filled 2022-08-29 (×3): qty 1

## 2022-08-29 MED ORDER — 0.9 % SODIUM CHLORIDE (POUR BTL) OPTIME
TOPICAL | Status: DC | PRN
  Administered 2022-08-29: 1000 mL

## 2022-08-29 MED ORDER — PROPOFOL 10 MG/ML IV BOLUS
INTRAVENOUS | Status: DC | PRN
  Administered 2022-08-29: 30 mg via INTRAVENOUS

## 2022-08-29 MED ORDER — ORAL CARE MOUTH RINSE: 15.0000 mL | Freq: Once | OROMUCOSAL | Status: AC

## 2022-08-29 MED ORDER — PROPOFOL 500 MG/50ML IV EMUL
INTRAVENOUS | Status: DC | PRN
  Administered 2022-08-29: 125 ug/kg/min via INTRAVENOUS

## 2022-08-29 MED ORDER — LOSARTAN POTASSIUM 50 MG PO TABS
50.0000 mg | ORAL_TABLET | Freq: Every day | ORAL | Status: DC
  Administered 2022-08-30: 50 mg via ORAL
  Filled 2022-08-29: qty 1

## 2022-08-29 MED ORDER — PHENOL 1.4 % MT LIQD: 1.0000 | OROMUCOSAL | Status: DC | PRN

## 2022-08-29 MED ORDER — ACETAMINOPHEN 325 MG PO TABS: 325.0000 mg | ORAL_TABLET | Freq: Four times a day (QID) | ORAL | Status: DC | PRN

## 2022-08-29 MED ORDER — OXYCODONE HCL 5 MG/5ML PO SOLN: 5.0000 mg | Freq: Once | ORAL | Status: DC | PRN

## 2022-08-29 MED ORDER — ONDANSETRON HCL 4 MG PO TABS: 4.0000 mg | ORAL_TABLET | Freq: Four times a day (QID) | ORAL | Status: DC | PRN

## 2022-08-29 MED ORDER — METOCLOPRAMIDE HCL 5 MG/ML IJ SOLN: 5.0000 mg | Freq: Three times a day (TID) | INTRAMUSCULAR | Status: DC | PRN

## 2022-08-29 MED ORDER — CALCIUM CARBONATE ANTACID 500 MG PO CHEW: 2.0000 | CHEWABLE_TABLET | Freq: Every day | ORAL | Status: DC | PRN

## 2022-08-29 MED ORDER — BUPIVACAINE IN DEXTROSE 0.75-8.25 % IT SOLN
INTRATHECAL | Status: DC | PRN
  Administered 2022-08-29: 2 mL via INTRATHECAL

## 2022-08-29 MED ORDER — OXYCODONE HCL 5 MG PO TABS
ORAL_TABLET | ORAL | Status: AC
  Filled 2022-08-29: qty 2

## 2022-08-29 MED ORDER — OXYCODONE HCL 5 MG PO TABS: 5.0000 mg | ORAL_TABLET | Freq: Once | ORAL | Status: DC | PRN

## 2022-08-29 MED ORDER — ONDANSETRON HCL 4 MG/2ML IJ SOLN: 4.0000 mg | Freq: Four times a day (QID) | INTRAMUSCULAR | Status: DC | PRN

## 2022-08-29 MED ORDER — OXYCODONE HCL 5 MG PO TABS
5.0000 mg | ORAL_TABLET | ORAL | Status: DC | PRN
  Administered 2022-08-29: 10 mg via ORAL
  Administered 2022-08-30: 5 mg via ORAL
  Filled 2022-08-29: qty 1

## 2022-08-29 MED ORDER — PANTOPRAZOLE SODIUM 40 MG PO TBEC
40.0000 mg | DELAYED_RELEASE_TABLET | Freq: Every day | ORAL | Status: DC
  Administered 2022-08-29 – 2022-08-30 (×2): 40 mg via ORAL
  Filled 2022-08-29 (×2): qty 1

## 2022-08-29 MED ORDER — HYDROMORPHONE HCL 2 MG PO TABS
2.0000 mg | ORAL_TABLET | ORAL | Status: DC | PRN
  Administered 2022-08-29 – 2022-08-30 (×5): 2 mg via ORAL
  Filled 2022-08-29 (×5): qty 1

## 2022-08-29 MED ORDER — FENTANYL CITRATE (PF) 100 MCG/2ML IJ SOLN
25.0000 ug | INTRAMUSCULAR | Status: DC | PRN
  Administered 2022-08-29 (×3): 50 ug via INTRAVENOUS

## 2022-08-29 MED ORDER — CEFAZOLIN SODIUM-DEXTROSE 1-4 GM/50ML-% IV SOLN
1.0000 g | Freq: Four times a day (QID) | INTRAVENOUS | Status: AC
  Administered 2022-08-29 (×2): 1 g via INTRAVENOUS
  Filled 2022-08-29 (×2): qty 50

## 2022-08-29 MED ORDER — METHOCARBAMOL 1000 MG/10ML IJ SOLN: 500.0000 mg | Freq: Four times a day (QID) | INTRAVENOUS | Status: DC | PRN

## 2022-08-29 MED ORDER — TRANEXAMIC ACID-NACL 1000-0.7 MG/100ML-% IV SOLN
INTRAVENOUS | Status: AC
  Filled 2022-08-29: qty 100

## 2022-08-29 MED ORDER — FENTANYL CITRATE (PF) 100 MCG/2ML IJ SOLN
INTRAMUSCULAR | Status: AC
  Filled 2022-08-29: qty 2

## 2022-08-29 MED ORDER — MIDAZOLAM HCL 2 MG/2ML IJ SOLN
INTRAMUSCULAR | Status: DC | PRN
  Administered 2022-08-29: 2 mg via INTRAVENOUS

## 2022-08-29 MED ORDER — ONDANSETRON HCL 4 MG/2ML IJ SOLN
INTRAMUSCULAR | Status: AC
  Filled 2022-08-29: qty 2

## 2022-08-29 MED ORDER — CEFAZOLIN SODIUM-DEXTROSE 2-4 GM/100ML-% IV SOLN
INTRAVENOUS | Status: AC
  Filled 2022-08-29: qty 100

## 2022-08-29 MED ORDER — METHOCARBAMOL 500 MG PO TABS
ORAL_TABLET | ORAL | Status: AC
  Filled 2022-08-29: qty 1

## 2022-08-29 MED ORDER — LACTATED RINGERS IV SOLN: INTRAVENOUS | Status: DC

## 2022-08-29 MED ORDER — DIPHENHYDRAMINE HCL 12.5 MG/5ML PO ELIX: 12.5000 mg | ORAL_SOLUTION | ORAL | Status: DC | PRN

## 2022-08-29 MED ORDER — ONDANSETRON HCL 4 MG/2ML IJ SOLN
INTRAMUSCULAR | Status: DC | PRN
  Administered 2022-08-29: 4 mg via INTRAVENOUS

## 2022-08-29 MED ORDER — HYDROMORPHONE HCL 1 MG/ML IJ SOLN
0.5000 mg | INTRAMUSCULAR | Status: DC | PRN
  Administered 2022-08-29 (×2): 1 mg via INTRAVENOUS
  Filled 2022-08-29: qty 1

## 2022-08-29 MED ORDER — ALUM & MAG HYDROXIDE-SIMETH 200-200-20 MG/5ML PO SUSP: 30.0000 mL | ORAL | Status: DC | PRN

## 2022-08-29 MED ORDER — CEFAZOLIN SODIUM-DEXTROSE 2-4 GM/100ML-% IV SOLN
2.0000 g | INTRAVENOUS | Status: AC
  Administered 2022-08-29: 2 g via INTRAVENOUS

## 2022-08-29 MED ORDER — MIDAZOLAM HCL 2 MG/2ML IJ SOLN
INTRAMUSCULAR | Status: AC
  Filled 2022-08-29: qty 2

## 2022-08-29 MED ORDER — MENTHOL 3 MG MT LOZG: 1.0000 | LOZENGE | OROMUCOSAL | Status: DC | PRN

## 2022-08-29 MED ORDER — PROPOFOL 1000 MG/100ML IV EMUL
INTRAVENOUS | Status: AC
  Filled 2022-08-29: qty 100

## 2022-08-29 MED ORDER — SODIUM CHLORIDE 0.9 % IR SOLN
Status: DC | PRN
  Administered 2022-08-29: 3000 mL

## 2022-08-29 MED ORDER — CHLORHEXIDINE GLUCONATE 0.12 % MT SOLN
OROMUCOSAL | Status: AC
  Administered 2022-08-29: 15 mL via OROMUCOSAL
  Filled 2022-08-29: qty 15

## 2022-08-29 MED ORDER — POVIDONE-IODINE 10 % EX SWAB
2.0000 | Freq: Once | CUTANEOUS | Status: AC
  Administered 2022-08-29: 2 via TOPICAL

## 2022-08-29 MED ORDER — PHENYLEPHRINE HCL-NACL 20-0.9 MG/250ML-% IV SOLN
INTRAVENOUS | Status: DC | PRN
  Administered 2022-08-29: 25 ug/min via INTRAVENOUS

## 2022-08-29 MED ORDER — ASPIRIN 81 MG PO CHEW
81.0000 mg | CHEWABLE_TABLET | Freq: Two times a day (BID) | ORAL | Status: DC
  Administered 2022-08-29 – 2022-08-30 (×2): 81 mg via ORAL
  Filled 2022-08-29 (×2): qty 1

## 2022-08-29 MED ORDER — DOCUSATE SODIUM 100 MG PO CAPS
100.0000 mg | ORAL_CAPSULE | Freq: Two times a day (BID) | ORAL | Status: DC
  Administered 2022-08-29 – 2022-08-30 (×3): 100 mg via ORAL
  Filled 2022-08-29 (×3): qty 1

## 2022-08-29 MED ORDER — HYDROMORPHONE HCL 1 MG/ML IJ SOLN
INTRAMUSCULAR | Status: AC
  Filled 2022-08-29: qty 1

## 2022-08-29 MED ORDER — METOCLOPRAMIDE HCL 5 MG PO TABS: 5.0000 mg | ORAL_TABLET | Freq: Three times a day (TID) | ORAL | Status: DC | PRN

## 2022-08-29 MED ORDER — CHLORHEXIDINE GLUCONATE 0.12 % MT SOLN: 15.0000 mL | Freq: Once | OROMUCOSAL | Status: AC

## 2022-08-29 SURGICAL SUPPLY — 53 items
ACETAB CUP W/GRIPTION 54 (Plate) ×1 IMPLANT
BAG COUNTER SPONGE SURGICOUNT (BAG) ×1 IMPLANT
BENZOIN TINCTURE PRP APPL 2/3 (GAUZE/BANDAGES/DRESSINGS) ×1 IMPLANT
BLADE CLIPPER SURG (BLADE) IMPLANT
BLADE SAW SGTL 18X1.27X75 (BLADE) ×1 IMPLANT
COVER SURGICAL LIGHT HANDLE (MISCELLANEOUS) ×1 IMPLANT
CUP ACETAB W/GRIPTION 54 (Plate) IMPLANT
DRAPE C-ARM 42X72 X-RAY (DRAPES) ×1 IMPLANT
DRAPE STERI IOBAN 125X83 (DRAPES) ×1 IMPLANT
DRAPE U-SHAPE 47X51 STRL (DRAPES) ×3 IMPLANT
DRSG AQUACEL AG ADV 3.5X10 (GAUZE/BANDAGES/DRESSINGS) ×1 IMPLANT
DURAPREP 26ML APPLICATOR (WOUND CARE) ×1 IMPLANT
ELECT BLADE 4.0 EZ CLEAN MEGAD (MISCELLANEOUS) ×1
ELECT BLADE 6.5 EXT (BLADE) IMPLANT
ELECT REM PT RETURN 9FT ADLT (ELECTROSURGICAL) ×1
ELECTRODE BLDE 4.0 EZ CLN MEGD (MISCELLANEOUS) ×1 IMPLANT
ELECTRODE REM PT RTRN 9FT ADLT (ELECTROSURGICAL) ×1 IMPLANT
FACESHIELD WRAPAROUND (MASK) ×3 IMPLANT
FACESHIELD WRAPAROUND OR TEAM (MASK) ×2 IMPLANT
GLOVE BIOGEL PI IND STRL 8 (GLOVE) ×2 IMPLANT
GLOVE ECLIPSE 8.0 STRL XLNG CF (GLOVE) ×1 IMPLANT
GLOVE ORTHO TXT STRL SZ7.5 (GLOVE) ×2 IMPLANT
GOWN STRL REUS W/ TWL LRG LVL3 (GOWN DISPOSABLE) ×2 IMPLANT
GOWN STRL REUS W/ TWL XL LVL3 (GOWN DISPOSABLE) ×2 IMPLANT
GOWN STRL REUS W/TWL LRG LVL3 (GOWN DISPOSABLE) ×2
GOWN STRL REUS W/TWL XL LVL3 (GOWN DISPOSABLE) ×2
HANDPIECE INTERPULSE COAX TIP (DISPOSABLE) ×1
HEAD CERAMIC 36 PLUS5 (Hips) IMPLANT
KIT BASIN OR (CUSTOM PROCEDURE TRAY) ×1 IMPLANT
KIT TURNOVER KIT B (KITS) ×1 IMPLANT
LINER NEUTRAL 54X36MM PLUS 4 (Hips) IMPLANT
MANIFOLD NEPTUNE II (INSTRUMENTS) ×1 IMPLANT
NS IRRIG 1000ML POUR BTL (IV SOLUTION) ×1 IMPLANT
PACK TOTAL JOINT (CUSTOM PROCEDURE TRAY) ×1 IMPLANT
PAD ARMBOARD 7.5X6 YLW CONV (MISCELLANEOUS) ×1 IMPLANT
SET HNDPC FAN SPRY TIP SCT (DISPOSABLE) ×1 IMPLANT
STAPLER VISISTAT 35W (STAPLE) IMPLANT
STEM FEMORAL SZ6 HIGH ACTIS (Stem) IMPLANT
STRIP CLOSURE SKIN 1/2X4 (GAUZE/BANDAGES/DRESSINGS) ×2 IMPLANT
SUT ETHIBOND NAB CT1 #1 30IN (SUTURE) ×1 IMPLANT
SUT MNCRL AB 4-0 PS2 18 (SUTURE) IMPLANT
SUT VIC AB 0 CT1 27 (SUTURE) ×1
SUT VIC AB 0 CT1 27XBRD ANBCTR (SUTURE) ×1 IMPLANT
SUT VIC AB 1 CT1 27 (SUTURE) ×1
SUT VIC AB 1 CT1 27XBRD ANBCTR (SUTURE) ×1 IMPLANT
SUT VIC AB 2-0 CT1 27 (SUTURE) ×1
SUT VIC AB 2-0 CT1 TAPERPNT 27 (SUTURE) ×1 IMPLANT
TOWEL GREEN STERILE (TOWEL DISPOSABLE) ×1 IMPLANT
TOWEL GREEN STERILE FF (TOWEL DISPOSABLE) ×1 IMPLANT
TRAY CATH 16FR W/PLASTIC CATH (SET/KITS/TRAYS/PACK) IMPLANT
TRAY FOLEY W/BAG SLVR 16FR (SET/KITS/TRAYS/PACK) ×1
TRAY FOLEY W/BAG SLVR 16FR ST (SET/KITS/TRAYS/PACK) IMPLANT
WATER STERILE IRR 1000ML POUR (IV SOLUTION) ×2 IMPLANT

## 2022-08-29 NOTE — H&P (Signed)
TOTAL HIP ADMISSION H&P  Patient is admitted for left total hip arthroplasty.  Subjective:  Chief Complaint: left hip pain  HPI: Christopher Andersen, 51 y.o. male, has a history of pain and functional disability in the left hip(s) due to  AVN  and patient has failed non-surgical conservative treatments for greater than 12 weeks to include NSAID's and/or analgesics and activity modification.  Onset of symptoms was abrupt starting 1 years ago with rapidlly worsening course since that time.The patient noted no past surgery on the left hip(s).  Patient currently rates pain in the left hip at 10 out of 10 with activity. Patient has night pain, worsening of pain with activity and weight bearing, pain that interfers with activities of daily living, and pain with passive range of motion. Patient has evidence of  avascular necrosis  by imaging studies. This condition presents safety issues increasing the risk of falls. This patient has had avascular necrosis of the hip, acetabular fracture, hip dysplasia.  There is no current active infection.  Patient Active Problem List   Diagnosis Date Noted   Shoulder pain 04/19/2022   Hip pain 04/19/2022   Status post total replacement of right hip 04/18/2022   Avascular necrosis of hip, left (HCC) 02/06/2022   Lateral epicondylitis of right elbow 05/10/2020   Anxiety 01/15/2018   Past Medical History:  Diagnosis Date   Anxiety    Arthritis    shoulder and neck   Asthma    as a child   Depression    GERD (gastroesophageal reflux disease)    occasional reflux. treat with OTC meds   Hypertension    Lipoma    removed from right face and current left inguinal   Pneumonia    few times as a child    Past Surgical History:  Procedure Laterality Date   LIPOMA EXCISION  2005   face   TONSILLECTOMY     TOTAL HIP ARTHROPLASTY Right 04/18/2022   Procedure: RIGHT TOTAL HIP ARTHROPLASTY ANTERIOR APPROACH;  Surgeon: Mcarthur Rossetti, MD;  Location: [place];   Service: Orthopedics;  Laterality: Right;    Current Facility-Administered Medications  Medication Dose Route Frequency Provider Last Rate Last Admin   ceFAZolin (ANCEF) 2-4 GM/100ML-% IVPB            ceFAZolin (ANCEF) IVPB 2g/100 mL premix  2 g Intravenous On Call to OR Pete Pelt, PA-C       lactated ringers infusion   Intravenous Continuous Lynda Rainwater, MD       tranexamic acid (CYKLOKAPRON) '1000MG'$ /12m IVPB            tranexamic acid (CYKLOKAPRON) IVPB 1,000 mg  1,000 mg Intravenous To OR CPete Pelt PA-C       No Known Allergies  Social History   Tobacco Use   Smoking status: Former    Types: Cigarettes   Smokeless tobacco: Former  Substance Use Topics   Alcohol use: Yes    Comment: 3-5 beers daily.    Family History  Problem Relation Age of Onset   Dementia Mother    Hypertension Mother    Ulcers Mother    Diabetes Father    Non-Hodgkin's lymphoma Father    Colon cancer Neg Hx    Stomach cancer Neg Hx    Esophageal cancer Neg Hx      Review of Systems  All other systems reviewed and are negative.   Objective:  Physical Exam Vitals reviewed.  Constitutional:  Appearance: Normal appearance. He is normal weight.  HENT:     Head: Normocephalic and atraumatic.  Eyes:     Extraocular Movements: Extraocular movements intact.     Pupils: Pupils are equal, round, and reactive to light.  Cardiovascular:     Rate and Rhythm: Normal rate and regular rhythm.     Pulses: Normal pulses.  Pulmonary:     Effort: Pulmonary effort is normal.     Breath sounds: Normal breath sounds.  Abdominal:     Palpations: Abdomen is soft.  Musculoskeletal:     Cervical back: Normal range of motion and neck supple.     Left hip: Tenderness and bony tenderness present. Decreased range of motion. Decreased strength.  Neurological:     Mental Status: He is alert and oriented to person, place, and time.  Psychiatric:        Behavior: Behavior normal.      Vital signs in last 24 hours: Temp:  [97.8 F (36.6 C)] 97.8 F (36.6 C) (10/31 0555) Pulse Rate:  [82] 82 (10/31 0555) Resp:  [18] 18 (10/31 0555) BP: (142)/(87) 142/87 (10/31 0555) SpO2:  [100 %] 100 % (10/31 0555) Weight:  [89.8 kg] 89.8 kg (10/31 0555)  Labs:   Estimated body mass index is 27.62 kg/m as calculated from the following:   Height as of this encounter: '5\' 11"'$  (1.803 m).   Weight as of this encounter: 89.8 kg.   Imaging Review MRI of the left hip demonstrate severe AVN of the left hip(s). The bone quality appears to be excellent for age and reported activity level.      Assessment/Plan:  Avascular necrosis, left hip(s)  The patient history, physical examination, clinical judgement of the provider and imaging studies are consistent with avascular necrosis of the left hip(s) and total hip arthroplasty is deemed medically necessary. The treatment options including medical management, injection therapy, arthroscopy and arthroplasty were discussed at length. The risks and benefits of total hip arthroplasty were presented and reviewed. The risks due to aseptic loosening, infection, stiffness, dislocation/subluxation,  thromboembolic complications and other imponderables were discussed.  The patient acknowledged the explanation, agreed to proceed with the plan and consent was signed. Patient is being admitted for inpatient treatment for surgery, pain control, PT, OT, prophylactic antibiotics, VTE prophylaxis, progressive ambulation and ADL's and discharge planning.The patient is planning to be discharged home with home health services

## 2022-08-29 NOTE — Op Note (Signed)
Operative Note  Date of operation: 08/29/2022 Preoperative diagnosis: Left hip avascular necrosis Postoperative diagnosis: Same  Procedure: Left direct anterior total hip arthroplasty  Implants: DePuy sector GRIPTION acetabular opponent size 54, 36+4 polythene liner, size 6 Actis femoral component with high offset, 36+5 ceramic head ball  Surgeon: Lind Guest. Ninfa Linden, MD Assistant: Benita Stabile, PA-C  Anesthesia: Spinal EBL: 150 cc Antibiotics: 2 g IV Ancef Complications: None  Indications: The patient is an active 51 year old gentleman with known well-documented avascular gnosis involving the left hip documented with plain films and MRI.  He actually developed AVN of his right hip and underwent a successful right total hip arthroplasty in just June of this year.  That hip is done well.  His left hip has been having rapidly worsening pain.  We recommended a total hip on this side as well given the advanced avascular necrosis.  The risk and benefits of surgery been explained in detail.  He understands the risk of acute blood loss anemia, nerve vessel injury, fracture, infection, DVT, dislocation, implant failure, leg length differences and wound healing issues.  He understands her goals are hopefully decrease pain, improve mobility, and improve quality of life.  Procedure description: After informed consent was obtained and the appropriate left hip was marked, the patient was brought to the operating room and set up on a stretcher where spinal anesthesia was obtained.  He was then laid in supine position on the stretcher and a Foley catheter was placed.  Traction boots were placed on both his feet and he was then placed supine on the Hana fracture table with a perineal post in place in both legs and inline skeletal traction devices but no traction applied.  We then assessed his left hip fluoroscopically and the pelvis at the beginning of the case.  The left hip was then prepped and draped  with DuraPrep and sterile drapes.  A timeout was called and he was notified as the correct patient the correct left hip.  An incision was then made just inferior and posterior to the ASIS and carried slightly obliquely down the leg.  Dissection was carried down to the tensor fascia lata muscle and the tensor fascia was then divided longitudinally to proceed with direct approach to the hip.  Circumflex vessels were identified cauterized and the hip capsule identified and opened up in an L-type format finding a large joint effusion.  Cobra retractors were placed around the medial and lateral femoral neck and a femoral neck cut was made with the oscillating saw just proximal to the lesser trochanter and this Was completed with an osteotome.  A corkscrew guide was placed in the femoral head the femoral head was removed in its entirety.  A bent Hohmann was then placed over the medial acetabular rim and remnants of the acetabular labrum and other debris removed from the acetabulum.  Reaming was then initiated from a size 43 reamer and stepwise increments up to a size 53 reamer with all reamers placed under direct visitation in the last reamer was placed under direct fluoroscopy so we could obtain our depth of reaming, our inclination, and our anteversion.  We then placed the real DePuy sector GRIPTION acetabular and a size 54 and went with a 36+4 polythene liner based on medialization of the cup and his offset.  Attention was then turned to the femur.  With the leg externally rotated to 120 degrees, extended and adducted, the leg was brought down and under and a Mueller retractor was  placed medially and a Hohmann tractor was placed behind the greater trochanter.  The lateral joint capsule was released and a box cutting osteotome was used to enter the femoral canal.  Broaching was then initiated from a size 0 broach going up to a size 6.  The size 6 broach was then placed with a high offset trial femoral neck and a 36+1.5  trial hip ball.  This reduced in the pelvis and based on radiographic assessment of.  Intraoperative radiographs we decided to go with a 36+5 ball for the real ball.  The hip was dislocated and all trial components were removed.  The real Actis femoral component with high offset size 6 was then placed without difficulty and the real 36+5 ceramic head ball was placed.  We again assessed it clinically and radiographically and we are pleased with leg length, offset, range of motion and stability.  The soft tissue was then irrigated normal saline solution.  The joint capsule was closed with interrupted #1 Ethibond suture followed by #1 Vicryl to close the tensor fascia.  0 Vicryl is used to close the deep tissue and 2-0 Vicryl is used to close subcutaneous tissue.  The skin was closed with staples.  An Aquacel dressing was applied.  The patient was taken off of the Hana table and taken the recovery room in stable addition with all final counts being correct and no complications noted.  Benita Stabile, PA-C did assist the entire case from beginning to end and his assistance was medically necessary for soft tissue management and retraction including assisting in guiding implant placement and a layered closure of the wound.

## 2022-08-29 NOTE — Anesthesia Procedure Notes (Signed)
Spinal  Patient location during procedure: OR Start time: 08/29/2022 7:35 AM End time: 08/29/2022 7:37 AM Reason for block: surgical anesthesia Staffing Performed: anesthesiologist  Anesthesiologist: Albertha Ghee, MD Performed by: Albertha Ghee, MD Authorized by: Albertha Ghee, MD   Preanesthetic Checklist Completed: patient identified, IV checked, risks and benefits discussed, surgical consent, monitors and equipment checked, pre-op evaluation and timeout performed Spinal Block Patient position: sitting Prep: DuraPrep Patient monitoring: cardiac monitor, continuous pulse ox and blood pressure Approach: midline Location: L3-4 Injection technique: single-shot Needle Needle type: Pencan  Needle gauge: 24 G Needle length: 9 cm Assessment Sensory level: T10 Events: CSF return Additional Notes Functioning IV was confirmed and monitors were applied. Sterile prep and drape, including hand hygiene and sterile gloves were used. The patient was positioned and the spine was prepped. The skin was anesthetized with lidocaine.  Free flow of clear CSF was obtained prior to injecting local anesthetic into the CSF.  The spinal needle aspirated freely following injection.  The needle was carefully withdrawn.  The patient tolerated the procedure well.

## 2022-08-29 NOTE — Transfer of Care (Signed)
Immediate Anesthesia Transfer of Care Note  Patient: Christopher Andersen  Procedure(s) Performed: LEFT TOTAL HIP ARTHROPLASTY ANTERIOR APPROACH (Left: Hip)  Patient Location: PACU  Anesthesia Type:Spinal  Level of Consciousness: awake, alert , and oriented  Airway & Oxygen Therapy: Patient Spontanous Breathing  Post-op Assessment: Report given to RN, Post -op Vital signs reviewed and stable, Patient moving all extremities X 4, and Patient able to stick tongue midline  Post vital signs: Reviewed  Last Vitals:  Vitals Value Taken Time  BP 130/90 08/29/22 0900  Temp 97.4   Pulse 71 08/29/22 0901  Resp 15 08/29/22 0900  SpO2 93 % 08/29/22 0901  Vitals shown include unvalidated device data.  Last Pain:  Vitals:   08/29/22 0613  TempSrc:   PainSc: 0-No pain         Complications: No notable events documented.

## 2022-08-29 NOTE — Anesthesia Preprocedure Evaluation (Signed)
Anesthesia Evaluation  Patient identified by MRN, date of birth, ID band Patient awake    Reviewed: Allergy & Precautions, H&P , NPO status , Patient's Chart, lab work & pertinent test results  Airway Mallampati: II   Neck ROM: full    Dental   Pulmonary asthma , former smoker,    breath sounds clear to auscultation       Cardiovascular hypertension,  Rhythm:regular Rate:Normal     Neuro/Psych PSYCHIATRIC DISORDERS Anxiety Depression    GI/Hepatic GERD  ,  Endo/Other    Renal/GU      Musculoskeletal  (+) Arthritis ,   Abdominal   Peds  Hematology   Anesthesia Other Findings   Reproductive/Obstetrics                             Anesthesia Physical Anesthesia Plan  ASA: 2  Anesthesia Plan: MAC and Spinal   Post-op Pain Management:    Induction: Intravenous  PONV Risk Score and Plan: 1 and Propofol infusion, Treatment may vary due to age or medical condition, Midazolam and Ondansetron  Airway Management Planned: Simple Face Mask  Additional Equipment:   Intra-op Plan:   Post-operative Plan:   Informed Consent: I have reviewed the patients History and Physical, chart, labs and discussed the procedure including the risks, benefits and alternatives for the proposed anesthesia with the patient or authorized representative who has indicated his/her understanding and acceptance.     Dental advisory given  Plan Discussed with: CRNA, Anesthesiologist and Surgeon  Anesthesia Plan Comments:         Anesthesia Quick Evaluation

## 2022-08-29 NOTE — Evaluation (Signed)
Physical Therapy Evaluation Patient Details Name: Christopher Andersen MRN: [id number] DOB: Jan 08, 1971 Today's Date: 08/29/2022  History of Present Illness  Pt is 51 yo male s/p L anterior THA on 08/29/22.  Pt with hx of AVN, R THA 6/23, HTN, GERD, anxiety  Clinical Impression  Pt is s/p THA resulting in the deficits listed below (see PT Problem List). At baseline, pt independent and working.  Today, pt reports increased pain but motivated to move.  He was able to ambulate 63' and transfer with min guard.  Pt reporting pain at 9/10 - attempted repositioning with pillows to support L LE and was premedicated.  Expected to progress well with therapy. Pt will benefit from skilled PT to increase their independence and safety with mobility to allow discharge to the venue listed below.         Recommendations for follow up therapy are one component of a multi-disciplinary discharge planning process, led by the attending physician.  Recommendations may be updated based on patient status, additional functional criteria and insurance authorization.  Follow Up Recommendations Follow physician's recommendations for discharge plan and follow up therapies      Assistance Recommended at Discharge Intermittent Supervision/Assistance  Patient can return home with the following  A little help with walking and/or transfers;A little help with bathing/dressing/bathroom;Assistance with cooking/housework;Help with stairs or ramp for entrance    Equipment Recommendations None recommended by PT  Recommendations for Other Services       Functional Status Assessment Patient has had a recent decline in their functional status and demonstrates the ability to make significant improvements in function in a reasonable and predictable amount of time.     Precautions / Restrictions Precautions Precautions: Fall Restrictions Weight Bearing Restrictions: Yes LLE Weight Bearing: Weight bearing as tolerated       Mobility  Bed Mobility Overal bed mobility: Needs Assistance Bed Mobility: Supine to Sit, Sit to Supine     Supine to sit: Supervision Sit to supine: Supervision        Transfers Overall transfer level: Needs assistance Equipment used: Rolling walker (2 wheels) Transfers: Sit to/from Stand Sit to Stand: Min guard           General transfer comment: Performed x 2 min guard to steady; cues for hand placement and L LE management    Ambulation/Gait Ambulation/Gait assistance: Min guard Gait Distance (Feet): 60 Feet Assistive device: Rolling walker (2 wheels) Gait Pattern/deviations: Step-through pattern, Decreased stride length, Antalgic Gait velocity: decreased     General Gait Details: Min cues for RW and posture  Stairs            Wheelchair Mobility    Modified Rankin (Stroke Patients Only)       Balance Overall balance assessment: Needs assistance Sitting-balance support: No upper extremity supported Sitting balance-Leahy Scale: Normal     Standing balance support: Bilateral upper extremity supported Standing balance-Leahy Scale: Fair Standing balance comment: RW but for pain control                             Pertinent Vitals/Pain Pain Assessment Pain Assessment: 0-10 Pain Score: 9  Pain Location: L hip Pain Descriptors / Indicators: Spasm Pain Intervention(s): Limited activity within patient's tolerance, Monitored during session, Premedicated before session, Repositioned, Ice applied    Home Living Family/patient expects to be discharged to:: Private residence Living Arrangements: Spouse/significant other Available Help at Discharge: Family;Available 24 hours/day Type of Home: House  Home Access: Stairs to enter Entrance Stairs-Rails: Right;Left Entrance Stairs-Number of Steps: 3 Alternate Level Stairs-Number of Steps: flight Home Layout: Two level;1/2 bath on main level Home Equipment: Crutches;Rolling Walker (2  wheels)      Prior Function Prior Level of Function : Independent/Modified Independent;Working/employed;Driving                     Hand Dominance        Extremity/Trunk Assessment   Upper Extremity Assessment Upper Extremity Assessment: Overall WFL for tasks assessed    Lower Extremity Assessment Lower Extremity Assessment: LLE deficits/detail LLE Deficits / Details: Expected post op changes; ROM WFL; MMT: ankle 5/5, knee 3/5, hip 3/5    Cervical / Trunk Assessment Cervical / Trunk Assessment: Normal  Communication   Communication: No difficulties  Cognition Arousal/Alertness: Awake/alert Behavior During Therapy: WFL for tasks assessed/performed Overall Cognitive Status: Within Functional Limits for tasks assessed                                          General Comments General comments (skin integrity, edema, etc.): Attempted different positions for comfort (chair, chair reclined, reclined with pillows, transferred back to bed with pillows to support)    Exercises     Assessment/Plan    PT Assessment Patient needs continued PT services  PT Problem List Decreased strength;Decreased mobility;Decreased range of motion;Decreased activity tolerance;Decreased balance;Pain;Decreased knowledge of use of DME       PT Treatment Interventions DME instruction;Therapeutic activities;Modalities;Gait training;Therapeutic exercise;Patient/family education;Stair training;Balance training;Functional mobility training    PT Goals (Current goals can be found in the Care Plan section)  Acute Rehab PT Goals Patient Stated Goal: return home PT Goal Formulation: With patient/family Time For Goal Achievement: 09/12/22 Potential to Achieve Goals: Good    Frequency 7X/week     Co-evaluation               AM-PAC PT "6 Clicks" Mobility  Outcome Measure Help needed turning from your back to your side while in a flat bed without using bedrails?:  None Help needed moving from lying on your back to sitting on the side of a flat bed without using bedrails?: A Little Help needed moving to and from a bed to a chair (including a wheelchair)?: A Little Help needed standing up from a chair using your arms (e.g., wheelchair or bedside chair)?: A Little Help needed to walk in hospital room?: A Little Help needed climbing 3-5 steps with a railing? : A Little 6 Click Score: 19    End of Session Equipment Utilized During Treatment: Gait belt Activity Tolerance: Patient limited by pain Patient left: in bed;with call bell/phone within reach;with family/visitor present Nurse Communication: Mobility status PT Visit Diagnosis: Other abnormalities of gait and mobility (R26.89);Muscle weakness (generalized) (M62.81)    Time: 8676-7209 PT Time Calculation (min) (ACUTE ONLY): 19 min   Charges:   PT Evaluation $PT Eval Low Complexity: 1 Low          Mina Babula, PT Acute Rehab [facility] [phone]   Karlton Lemon 08/29/2022, 1:34 PM

## 2022-08-29 NOTE — Anesthesia Postprocedure Evaluation (Signed)
Anesthesia Post Note  Patient: Christopher Andersen  Procedure(s) Performed: LEFT TOTAL HIP ARTHROPLASTY ANTERIOR APPROACH (Left: Hip)     Patient location during evaluation: PACU Anesthesia Type: MAC and Spinal Level of consciousness: oriented and awake and alert Pain management: pain level controlled Vital Signs Assessment: post-procedure vital signs reviewed and stable Respiratory status: spontaneous breathing, respiratory function stable and patient connected to nasal cannula oxygen Cardiovascular status: blood pressure returned to baseline and stable Postop Assessment: no headache, no backache and no apparent nausea or vomiting Anesthetic complications: no   No notable events documented.  Last Vitals:  Vitals:   08/29/22 1100 08/29/22 1120  BP: (!) 137/100 (!) 139/111  Pulse: (!) 59 64  Resp:  18  Temp:    SpO2: 98% 100%    Last Pain:  Vitals:   08/29/22 1000  TempSrc:   PainSc: [place]

## 2022-08-30 ENCOUNTER — Encounter: Payer: Self-pay | Admitting: Orthopaedic Surgery

## 2022-08-30 ENCOUNTER — Encounter (HOSPITAL_COMMUNITY): Payer: Self-pay | Admitting: Orthopaedic Surgery

## 2022-08-30 ENCOUNTER — Telehealth: Payer: Self-pay | Admitting: Orthopaedic Surgery

## 2022-08-30 DIAGNOSIS — I10 Essential (primary) hypertension: Secondary | ICD-10-CM | POA: Diagnosis not present

## 2022-08-30 DIAGNOSIS — M87852 Other osteonecrosis, left femur: Secondary | ICD-10-CM | POA: Diagnosis not present

## 2022-08-30 DIAGNOSIS — J45909 Unspecified asthma, uncomplicated: Secondary | ICD-10-CM | POA: Diagnosis not present

## 2022-08-30 DIAGNOSIS — Z87891 Personal history of nicotine dependence: Secondary | ICD-10-CM | POA: Diagnosis not present

## 2022-08-30 DIAGNOSIS — Z96641 Presence of right artificial hip joint: Secondary | ICD-10-CM | POA: Diagnosis not present

## 2022-08-30 LAB — CBC
HCT: 43.5 % (ref 39.0–52.0)
Hemoglobin: 14.6 g/dL (ref 13.0–17.0)
MCH: 30.6 pg (ref 26.0–34.0)
MCHC: 33.6 g/dL (ref 30.0–36.0)
MCV: 91.2 fL (ref 80.0–100.0)
Platelets: 224 10*3/uL (ref 150–400)
RBC: 4.77 MIL/uL (ref 4.22–5.81)
RDW: 12.8 % (ref 11.5–15.5)
WBC: 11.7 10*3/uL — ABNORMAL HIGH (ref 4.0–10.5)
nRBC: 0 % (ref 0.0–0.2)

## 2022-08-30 LAB — BASIC METABOLIC PANEL
Anion gap: 11 (ref 5–15)
BUN: 11 mg/dL (ref 6–20)
CO2: 22 mmol/L (ref 22–32)
Calcium: 8.9 mg/dL (ref 8.9–10.3)
Chloride: 102 mmol/L (ref 98–111)
Creatinine, Ser: 1.21 mg/dL (ref 0.61–1.24)
GFR, Estimated: >60 mL/min (ref >60)
Glucose, Bld: 132 mg/dL — ABNORMAL HIGH (ref 70–99)
Potassium: 3.9 mmol/L (ref 3.5–5.1)
Sodium: 135 mmol/L (ref 135–145)

## 2022-08-30 MED ORDER — CYCLOBENZAPRINE HCL 10 MG PO TABS: 10.0000 mg | ORAL_TABLET | Freq: Every day | ORAL | 0 refills | Status: DC | PRN

## 2022-08-30 MED ORDER — KETOROLAC TROMETHAMINE 10 MG PO TABS: 10.0000 mg | ORAL_TABLET | Freq: Four times a day (QID) | ORAL | 0 refills | Status: DC | PRN

## 2022-08-30 MED ORDER — HYDROMORPHONE HCL 4 MG PO TABS: 4.0000 mg | ORAL_TABLET | ORAL | 0 refills | Status: DC | PRN

## 2022-08-30 MED ORDER — ACETAMINOPHEN 325 MG PO TABS
325.0000 mg | ORAL_TABLET | Freq: Four times a day (QID) | ORAL | Status: DC | PRN
  Administered 2022-08-30: 650 mg via ORAL
  Filled 2022-08-30: qty 2

## 2022-08-30 NOTE — Telephone Encounter (Signed)
FYI Patient called advised he is at the pharmacy and was told his Rx for Dilaudid need prior Auth before the Rx can be filled.   Patient was advised and aware that it will take a little time to get Rx authorized and filled. The number to contact patient is [phone]

## 2022-08-30 NOTE — Progress Notes (Signed)
Subjective: 1 Day Post-Op Procedure(s) (LRB): LEFT TOTAL HIP ARTHROPLASTY ANTERIOR APPROACH (Left) Patient reports pain as moderate.    Objective: Vital signs in last 24 hours: Temp:  [97.4 F (36.3 C)-99.8 F (37.7 C)] 99.3 F (37.4 C) (11/01 0428) Pulse Rate:  [53-98] 96 (11/01 0428) Resp:  [10-20] 20 (11/01 0428) BP: (115-151)/(80-111) 134/85 (11/01 0428) SpO2:  [93 %-100 %] 100 % (11/01 0428)  Intake/Output from previous day: 10/31 0701 - 11/01 0700 In: 2720 [P.O.:720; I.V.:1800; IV Piggyback:200] Out: 2750 [Urine:2550; Blood:200] Intake/Output this shift: No intake/output data recorded.  Recent Labs    08/30/22 0612  HGB 14.6   Recent Labs    08/30/22 0612  WBC 11.7*  RBC 4.77  HCT 43.5  PLT 224   Recent Labs    08/30/22 0612  NA 135  K 3.9  CL 102  CO2 22  BUN 11  CREATININE 1.21  GLUCOSE 132*  CALCIUM 8.9   No results for input(s): "LABPT", "INR" in the last 72 hours.  Sensation intact distally Intact pulses distally Dorsiflexion/Plantar flexion intact Incision: scant drainage   Assessment/Plan: 1 Day Post-Op Procedure(s) (LRB): LEFT TOTAL HIP ARTHROPLASTY ANTERIOR APPROACH (Left) Up with therapy Discharge home with home health      Christopher Andersen 08/30/2022, 7:54 AM

## 2022-08-30 NOTE — Plan of Care (Signed)
  Problem: Education: Goal: Knowledge of the prescribed therapeutic regimen will improve Outcome: Completed/Met Goal: Understanding of discharge needs will improve Outcome: Completed/Met Goal: Individualized Educational Video(s) Outcome: Completed/Met   Problem: Activity: Goal: Ability to avoid complications of mobility impairment will improve Outcome: Completed/Met Goal: Ability to tolerate increased activity will improve Outcome: Completed/Met   Problem: Clinical Measurements: Goal: Postoperative complications will be avoided or minimized Outcome: Completed/Met   Problem: Pain Management: Goal: Pain level will decrease with appropriate interventions Outcome: Completed/Met   Problem: Skin Integrity: Goal: Will show signs of wound healing Outcome: Completed/Met   Problem: Education: Goal: Knowledge of General Education information will improve Description: Including pain rating scale, medication(s)/side effects and non-pharmacologic comfort measures Outcome: Completed/Met   Problem: Health Behavior/Discharge Planning: Goal: Ability to manage health-related needs will improve Outcome: Completed/Met

## 2022-08-30 NOTE — Progress Notes (Signed)
Physical Therapy Treatment Patient Details Name: Christopher Andersen MRN: [id number] DOB: February 03, 1971 Today's Date: 08/30/2022   History of Present Illness Pt is 51 yo male s/p L anterior THA on 08/29/22.  Pt with hx of AVN, R THA 6/23, HTN, GERD, anxiety    PT Comments    Patient progressing towards physical therapy goals. Able to ambulate hallway distance with RW and supervision. Negotiated 10 stairs with R hand rail and supervision to safely access home environment. Patient deferred exercises to HHPT as they are planned to come day after discharge. D/c plan remains appropriate.     Recommendations for follow up therapy are one component of a multi-disciplinary discharge planning process, led by the attending physician.  Recommendations may be updated based on patient status, additional functional criteria and insurance authorization.  Follow Up Recommendations  Follow physician's recommendations for discharge plan and follow up therapies     Assistance Recommended at Discharge Intermittent Supervision/Assistance  Patient can return home with the following A little help with walking and/or transfers;A little help with bathing/dressing/bathroom;Assistance with cooking/housework;Help with stairs or ramp for entrance   Equipment Recommendations  None recommended by PT    Recommendations for Other Services       Precautions / Restrictions Precautions Precautions: Fall Restrictions Weight Bearing Restrictions: Yes LLE Weight Bearing: Weight bearing as tolerated     Mobility  Bed Mobility Overal bed mobility: Modified Independent             General bed mobility comments: increased time to complete but able to complete without assistance. Instructed on using R LE hooked under L to assist in/out of bed    Transfers Overall transfer level: Needs assistance Equipment used: Rolling Shira Bobst (2 wheels) Transfers: Sit to/from Stand Sit to Stand: Supervision           General  transfer comment: supervision for safety. Good recall of hand placement    Ambulation/Gait Ambulation/Gait assistance: Supervision Gait Distance (Feet): 150 Feet Assistive device: Rolling Marne Meline (2 wheels) Gait Pattern/deviations: Step-through pattern, Decreased stride length, Antalgic Gait velocity: decreased     General Gait Details: supervision for safety. Cues for sequencing   Stairs Stairs: Yes Stairs assistance: Supervision Stair Management: One rail Right, Step to pattern, Forwards Number of Stairs: 10 General stair comments: instructed on safe stair negotiation with up with good and down with weaker leg   Wheelchair Mobility    Modified Rankin (Stroke Patients Only)       Balance Overall balance assessment: Needs assistance Sitting-balance support: No upper extremity supported Sitting balance-Leahy Scale: Normal     Standing balance support: Bilateral upper extremity supported Standing balance-Leahy Scale: Fair                              Cognition Arousal/Alertness: Awake/alert Behavior During Therapy: WFL for tasks assessed/performed Overall Cognitive Status: Within Functional Limits for tasks assessed                                          Exercises      General Comments        Pertinent Vitals/Pain Pain Assessment Pain Assessment: Faces Faces Pain Scale: Hurts even more Pain Location: L hip Pain Descriptors / Indicators: Grimacing, Guarding, Aching Pain Intervention(s): Monitored during session, Repositioned    Home Living  Prior Function            PT Goals (current goals can now be found in the care plan section) Acute Rehab PT Goals Patient Stated Goal: return home PT Goal Formulation: With patient/family Time For Goal Achievement: 09/12/22 Potential to Achieve Goals: Good Progress towards PT goals: Progressing toward goals    Frequency    7X/week       PT Plan Current plan remains appropriate    Co-evaluation              AM-PAC PT "6 Clicks" Mobility   Outcome Measure  Help needed turning from your back to your side while in a flat bed without using bedrails?: None Help needed moving from lying on your back to sitting on the side of a flat bed without using bedrails?: None Help needed moving to and from a bed to a chair (including a wheelchair)?: A Little Help needed standing up from a chair using your arms (e.g., wheelchair or bedside chair)?: A Little Help needed to walk in hospital room?: A Little Help needed climbing 3-5 steps with a railing? : A Little 6 Click Score: 20    End of Session   Activity Tolerance: Patient tolerated treatment well Patient left: in bed;with call bell/phone within reach Nurse Communication: Mobility status PT Visit Diagnosis: Other abnormalities of gait and mobility (R26.89);Muscle weakness (generalized) (M62.81)     Time: 9030-0923 PT Time Calculation (min) (ACUTE ONLY): 14 min  Charges:  $Gait Training: 8-22 mins                     Geofrey Silliman A. Gilford Rile PT, DPT Acute Rehabilitation Services Office [phone]    Linna Hoff 08/30/2022, 9:38 AM

## 2022-08-30 NOTE — Discharge Instructions (Signed)

## 2022-08-30 NOTE — Progress Notes (Signed)
Patient alert and oriented, voiding adequately, skin clean, dry and intact without evidence of skin break down, or symptoms of complications - no redness or edema noted, only slight tenderness at site.  Patient states pain is manageable at time of discharge. Patient has an appointment with MD in 2 weeks 

## 2022-08-30 NOTE — Discharge Summary (Signed)
Patient ID: Christopher Andersen MRN: [id number] DOB/AGE: August 08, 1971 51 y.o.  Admit date: 08/29/2022 Discharge date: 08/30/2022  Admission Diagnoses:  Principal Problem:   Avascular necrosis of hip, left ([place]) Active Problems:   Status post total replacement of left hip   Discharge Diagnoses:  Same  Past Medical History:  Diagnosis Date   Anxiety    Arthritis    shoulder and neck   Asthma    as a child   Depression    GERD (gastroesophageal reflux disease)    occasional reflux. treat with OTC meds   Hypertension    Lipoma    removed from right face and current left inguinal   Pneumonia    few times as a child    Surgeries: Procedure(s): LEFT TOTAL HIP ARTHROPLASTY ANTERIOR APPROACH on 08/29/2022   Consultants:   Discharged Condition: Improved  Hospital Course: Christopher Andersen is an 51 y.o. male who was admitted 08/29/2022 for operative treatment ofAvascular necrosis of hip, left ([place]). Patient has severe unremitting pain that affects sleep, daily activities, and work/hobbies. After pre-op clearance the patient was taken to the operating room on 08/29/2022 and underwent  Procedure(s): LEFT TOTAL HIP ARTHROPLASTY ANTERIOR APPROACH.    Patient was given perioperative antibiotics:  Anti-infectives (From admission, onward)    Start     Dose/Rate Route Frequency Ordered Stop   08/29/22 1330  ceFAZolin (ANCEF) IVPB 1 g/50 mL premix        1 g 100 mL/hr over 30 Minutes Intravenous Every 6 hours 08/29/22 0938 08/29/22 1922   08/29/22 0604  ceFAZolin (ANCEF) 2-4 GM/100ML-% IVPB       Note to Pharmacy: Rocky Morel D: cabinet override      08/29/22 0604 08/29/22 0743   08/29/22 0600  ceFAZolin (ANCEF) IVPB 2g/100 mL premix        2 g 200 mL/hr over 30 Minutes Intravenous On call to O.R. 08/29/22 0557 08/29/22 0739        Patient was given sequential compression devices, early ambulation, and chemoprophylaxis to prevent DVT.  Patient benefited maximally from hospital  stay and there were no complications.    Recent vital signs: Patient Vitals for the past 24 hrs:  BP Temp Temp src Pulse Resp SpO2  08/30/22 0428 134/85 99.3 F (37.4 C) Oral 96 20 100 %  08/29/22 2303 (!) 141/93 99.8 F (37.7 C) Oral 98 20 97 %  08/29/22 1931 (!) 146/92 99.3 F (37.4 C) Oral 85 18 99 %  08/29/22 1531 (!) 151/99 97.6 F (36.4 C) Oral 78 16 99 %  08/29/22 1120 (!) 139/111 -- -- 64 18 100 %  08/29/22 1100 (!) 137/100 -- -- (!) 59 -- 98 %  08/29/22 1045 -- -- -- 72 -- 98 %  08/29/22 1024 (!) 126/95 -- -- 65 -- 99 %  08/29/22 1000 115/82 (!) 97.4 F (36.3 C) -- (!) 53 10 98 %  08/29/22 0945 116/80 -- -- 64 14 97 %  08/29/22 0930 (!) 143/85 -- -- 66 15 94 %  08/29/22 0915 117/82 -- -- 71 13 93 %  08/29/22 0900 (!) 130/90 (!) 97.4 F (36.3 C) -- 72 15 96 %     Recent laboratory studies:  Recent Labs    08/30/22 0612  WBC 11.7*  HGB 14.6  HCT 43.5  PLT 224  NA 135  K 3.9  CL 102  CO2 22  BUN 11  CREATININE 1.21  GLUCOSE 132*  CALCIUM 8.9  Discharge Medications:   Allergies as of 08/30/2022   No Known Allergies      Medication List     STOP taking these medications    traMADol 50 MG tablet Commonly known as: ULTRAM       TAKE these medications    calcium carbonate 500 MG chewable tablet Commonly known as: TUMS - dosed in mg elemental calcium Chew 2 tablets by mouth daily as needed for indigestion or heartburn.   cyclobenzaprine 10 MG tablet Commonly known as: FLEXERIL Take 1 tablet (10 mg total) by mouth daily as needed for muscle spasms.   HYDROmorphone 4 MG tablet Commonly known as: Dilaudid Take 1 tablet (4 mg total) by mouth every 4 (four) hours as needed for severe pain.   ketorolac 10 MG tablet Commonly known as: TORADOL Take 1 tablet (10 mg total) by mouth every 6 (six) hours as needed. This medication will not be refilled   losartan 50 MG tablet Commonly known as: COZAAR TAKE 1 TABLET BY MOUTH EVERY DAY                Durable Medical Equipment  (From admission, onward)           Start     Ordered   08/29/22 1128  DME 3 n 1  Once        08/29/22 1127   08/29/22 1128  DME Walker rolling  Once       Question Answer Comment  Walker: With 5 Inch Wheels   Patient needs a walker to treat with the following condition Status post total replacement of left hip      08/29/22 1127            Diagnostic Studies: DG Pelvis Portable  Result Date: 08/29/2022 CLINICAL DATA:  Status post total left hip arthroplasty. EXAM: PORTABLE PELVIS 1-2 VIEWS COMPARISON:  KUB 05/26/2022, AP pelvis 04/18/2022, MRI pelvis 07/11/2022 FINDINGS: Interval total left hip arthroplasty. Redemonstration of total right hip arthroplasty. No perihardware lucency is seen to indicate hardware failure or loosening. Expected postoperative changes including lateral left hip surgical skin staples, intra-articular air, and lateral subcutaneous air. No acute fracture or dislocation. IMPRESSION: Interval total left hip arthroplasty without evidence of hardware failure. Electronically Signed   By: Yvonne Kendall M.D.   On: 08/29/2022 09:46   DG HIP UNILAT WITH PELVIS 1V LEFT  Result Date: 08/29/2022 CLINICAL DATA:  Status post total left hip arthroplasty. Intraoperative fluoroscopy. EXAM: DG HIP (WITH OR WITHOUT PELVIS) 1V*L* COMPARISON:  Pelvis and right hip radiographs 09/26/2021, MRI pelvis 07/11/2022 FINDINGS: Images were performed intraoperatively without the presence of a radiologist. Interval total left hip arthroplasty. Redemonstration of prior total right hip arthroplasty, partially visualized. No hardware complication is seen. Total fluoroscopy images: 3 Total fluoroscopy time: 15 seconds Total dose: Radiation Exposure Index (as provided by the fluoroscopic device): 1.85 mGy air Kerma Please see intraoperative findings for further detail. IMPRESSION: Intraoperative fluoroscopy during total left hip arthroplasty. Electronically Signed    By: Yvonne Kendall M.D.   On: 08/29/2022 09:43   DG C-Arm 1-60 Min-No Report  Result Date: 08/29/2022 Fluoroscopy was utilized by the requesting physician.  No radiographic interpretation.    Disposition: Discharge disposition: 01-Home or [place] Follow up.   Specialty: [place] Why: The home health agency will contact you for the first home visit Contact information: [address] [place] [address] [phone]         Mcarthur Rossetti, MD Follow up in 2 week(s).   Specialty: Orthopedic Surgery Contact information: [address] [place] [address] [phone]                  Signed: Mcarthur Rossetti 08/30/2022, 7:57 AM

## 2022-08-31 ENCOUNTER — Other Ambulatory Visit: Payer: Self-pay | Admitting: Orthopaedic Surgery

## 2022-08-31 DIAGNOSIS — Z471 Aftercare following joint replacement surgery: Secondary | ICD-10-CM | POA: Diagnosis not present

## 2022-08-31 MED ORDER — HYDROMORPHONE HCL 4 MG PO TABS: 4.0000 mg | ORAL_TABLET | ORAL | 0 refills | Status: DC | PRN

## 2022-09-02 DIAGNOSIS — Z471 Aftercare following joint replacement surgery: Secondary | ICD-10-CM | POA: Diagnosis not present

## 2022-09-04 ENCOUNTER — Encounter: Payer: Self-pay | Admitting: Orthopaedic Surgery

## 2022-09-04 DIAGNOSIS — Z471 Aftercare following joint replacement surgery: Secondary | ICD-10-CM | POA: Diagnosis not present

## 2022-09-06 DIAGNOSIS — F32A Depression, unspecified: Secondary | ICD-10-CM | POA: Diagnosis not present

## 2022-09-06 DIAGNOSIS — K219 Gastro-esophageal reflux disease without esophagitis: Secondary | ICD-10-CM | POA: Diagnosis not present

## 2022-09-06 DIAGNOSIS — I10 Essential (primary) hypertension: Secondary | ICD-10-CM | POA: Diagnosis not present

## 2022-09-06 DIAGNOSIS — Z87891 Personal history of nicotine dependence: Secondary | ICD-10-CM | POA: Diagnosis not present

## 2022-09-06 DIAGNOSIS — Z7982 Long term (current) use of aspirin: Secondary | ICD-10-CM | POA: Diagnosis not present

## 2022-09-06 DIAGNOSIS — Z96642 Presence of left artificial hip joint: Secondary | ICD-10-CM | POA: Diagnosis not present

## 2022-09-06 DIAGNOSIS — J45909 Unspecified asthma, uncomplicated: Secondary | ICD-10-CM | POA: Diagnosis not present

## 2022-09-06 DIAGNOSIS — Z9181 History of falling: Secondary | ICD-10-CM | POA: Diagnosis not present

## 2022-09-06 DIAGNOSIS — Z85828 Personal history of other malignant neoplasm of skin: Secondary | ICD-10-CM | POA: Diagnosis not present

## 2022-09-06 DIAGNOSIS — Z471 Aftercare following joint replacement surgery: Secondary | ICD-10-CM | POA: Diagnosis not present

## 2022-09-06 DIAGNOSIS — Z8701 Personal history of pneumonia (recurrent): Secondary | ICD-10-CM | POA: Diagnosis not present

## 2022-09-06 DIAGNOSIS — F419 Anxiety disorder, unspecified: Secondary | ICD-10-CM | POA: Diagnosis not present

## 2022-09-06 DIAGNOSIS — M19019 Primary osteoarthritis, unspecified shoulder: Secondary | ICD-10-CM | POA: Diagnosis not present

## 2022-09-07 ENCOUNTER — Other Ambulatory Visit: Payer: Self-pay | Admitting: Orthopaedic Surgery

## 2022-09-07 DIAGNOSIS — Z7982 Long term (current) use of aspirin: Secondary | ICD-10-CM | POA: Diagnosis not present

## 2022-09-07 DIAGNOSIS — Z87891 Personal history of nicotine dependence: Secondary | ICD-10-CM | POA: Diagnosis not present

## 2022-09-07 DIAGNOSIS — Z85828 Personal history of other malignant neoplasm of skin: Secondary | ICD-10-CM | POA: Diagnosis not present

## 2022-09-07 DIAGNOSIS — Z96642 Presence of left artificial hip joint: Secondary | ICD-10-CM | POA: Diagnosis not present

## 2022-09-07 DIAGNOSIS — I10 Essential (primary) hypertension: Secondary | ICD-10-CM | POA: Diagnosis not present

## 2022-09-07 DIAGNOSIS — Z471 Aftercare following joint replacement surgery: Secondary | ICD-10-CM | POA: Diagnosis not present

## 2022-09-07 DIAGNOSIS — F419 Anxiety disorder, unspecified: Secondary | ICD-10-CM | POA: Diagnosis not present

## 2022-09-07 DIAGNOSIS — J45909 Unspecified asthma, uncomplicated: Secondary | ICD-10-CM | POA: Diagnosis not present

## 2022-09-07 DIAGNOSIS — Z8701 Personal history of pneumonia (recurrent): Secondary | ICD-10-CM | POA: Diagnosis not present

## 2022-09-07 DIAGNOSIS — Z9181 History of falling: Secondary | ICD-10-CM | POA: Diagnosis not present

## 2022-09-07 DIAGNOSIS — F32A Depression, unspecified: Secondary | ICD-10-CM | POA: Diagnosis not present

## 2022-09-07 DIAGNOSIS — M19019 Primary osteoarthritis, unspecified shoulder: Secondary | ICD-10-CM | POA: Diagnosis not present

## 2022-09-07 DIAGNOSIS — K219 Gastro-esophageal reflux disease without esophagitis: Secondary | ICD-10-CM | POA: Diagnosis not present

## 2022-09-07 MED ORDER — HYDROMORPHONE HCL 4 MG PO TABS: 4.0000 mg | ORAL_TABLET | ORAL | 0 refills | Status: DC | PRN

## 2022-09-11 DIAGNOSIS — Z96642 Presence of left artificial hip joint: Secondary | ICD-10-CM | POA: Diagnosis not present

## 2022-09-11 DIAGNOSIS — F419 Anxiety disorder, unspecified: Secondary | ICD-10-CM | POA: Diagnosis not present

## 2022-09-11 DIAGNOSIS — K219 Gastro-esophageal reflux disease without esophagitis: Secondary | ICD-10-CM | POA: Diagnosis not present

## 2022-09-11 DIAGNOSIS — M19019 Primary osteoarthritis, unspecified shoulder: Secondary | ICD-10-CM | POA: Diagnosis not present

## 2022-09-11 DIAGNOSIS — Z85828 Personal history of other malignant neoplasm of skin: Secondary | ICD-10-CM | POA: Diagnosis not present

## 2022-09-11 DIAGNOSIS — Z9181 History of falling: Secondary | ICD-10-CM | POA: Diagnosis not present

## 2022-09-11 DIAGNOSIS — Z87891 Personal history of nicotine dependence: Secondary | ICD-10-CM | POA: Diagnosis not present

## 2022-09-11 DIAGNOSIS — Z8701 Personal history of pneumonia (recurrent): Secondary | ICD-10-CM | POA: Diagnosis not present

## 2022-09-11 DIAGNOSIS — Z471 Aftercare following joint replacement surgery: Secondary | ICD-10-CM | POA: Diagnosis not present

## 2022-09-11 DIAGNOSIS — Z7982 Long term (current) use of aspirin: Secondary | ICD-10-CM | POA: Diagnosis not present

## 2022-09-11 DIAGNOSIS — F32A Depression, unspecified: Secondary | ICD-10-CM | POA: Diagnosis not present

## 2022-09-11 DIAGNOSIS — I10 Essential (primary) hypertension: Secondary | ICD-10-CM | POA: Diagnosis not present

## 2022-09-11 DIAGNOSIS — J45909 Unspecified asthma, uncomplicated: Secondary | ICD-10-CM | POA: Diagnosis not present

## 2022-09-12 ENCOUNTER — Encounter: Payer: Self-pay | Admitting: Orthopaedic Surgery

## 2022-09-12 ENCOUNTER — Ambulatory Visit (INDEPENDENT_AMBULATORY_CARE_PROVIDER_SITE_OTHER): Payer: BC Managed Care – PPO | Admitting: Orthopaedic Surgery

## 2022-09-12 DIAGNOSIS — Z96642 Presence of left artificial hip joint: Secondary | ICD-10-CM

## 2022-09-12 MED ORDER — HYDROMORPHONE HCL 4 MG PO TABS: 4.0000 mg | ORAL_TABLET | ORAL | 0 refills | Status: DC | PRN

## 2022-09-12 NOTE — Progress Notes (Signed)
The patient is here today for his first postoperative visit status post a left total hip arthroplasty secondary to avascular necrosis.  We replaced his right hip successfully a few months ago.  He is only 51 years old.  He has been compliant with a baby aspirin twice a day.  He has had to take hydromorphone for pain.  He is ambulating now with just a cane.  He is very pleased overall.  His leg lengths are nearly equal.  He may be just a touch short on the left side but it is really not noticeable from my standpoint even examining him and he is not noticing it either.  His incision was great.  The staples are removed and Steri-Strips applied.  He has moderate swelling but no seroma.  His calf is soft and his foot and ankle show no swelling.  He will continue to increase his activities as comfort allows.  He knows to wait at least 2 more weeks before submerging underwater.  Again he will stop his baby aspirin.  I did refill his hydromorphone.  He is going to start the weaning process as well.  We will see him back in 4 weeks to see how is doing overall but no x-rays are needed.

## 2022-09-13 DIAGNOSIS — F32A Depression, unspecified: Secondary | ICD-10-CM | POA: Diagnosis not present

## 2022-09-13 DIAGNOSIS — M19019 Primary osteoarthritis, unspecified shoulder: Secondary | ICD-10-CM | POA: Diagnosis not present

## 2022-09-13 DIAGNOSIS — Z87891 Personal history of nicotine dependence: Secondary | ICD-10-CM | POA: Diagnosis not present

## 2022-09-13 DIAGNOSIS — J45909 Unspecified asthma, uncomplicated: Secondary | ICD-10-CM | POA: Diagnosis not present

## 2022-09-13 DIAGNOSIS — Z9181 History of falling: Secondary | ICD-10-CM | POA: Diagnosis not present

## 2022-09-13 DIAGNOSIS — Z85828 Personal history of other malignant neoplasm of skin: Secondary | ICD-10-CM | POA: Diagnosis not present

## 2022-09-13 DIAGNOSIS — Z471 Aftercare following joint replacement surgery: Secondary | ICD-10-CM | POA: Diagnosis not present

## 2022-09-13 DIAGNOSIS — K219 Gastro-esophageal reflux disease without esophagitis: Secondary | ICD-10-CM | POA: Diagnosis not present

## 2022-09-13 DIAGNOSIS — F419 Anxiety disorder, unspecified: Secondary | ICD-10-CM | POA: Diagnosis not present

## 2022-09-13 DIAGNOSIS — Z96642 Presence of left artificial hip joint: Secondary | ICD-10-CM | POA: Diagnosis not present

## 2022-09-13 DIAGNOSIS — I10 Essential (primary) hypertension: Secondary | ICD-10-CM | POA: Diagnosis not present

## 2022-09-13 DIAGNOSIS — Z8701 Personal history of pneumonia (recurrent): Secondary | ICD-10-CM | POA: Diagnosis not present

## 2022-09-13 DIAGNOSIS — Z7982 Long term (current) use of aspirin: Secondary | ICD-10-CM | POA: Diagnosis not present

## 2022-09-14 DIAGNOSIS — Z87891 Personal history of nicotine dependence: Secondary | ICD-10-CM | POA: Diagnosis not present

## 2022-09-14 DIAGNOSIS — Z85828 Personal history of other malignant neoplasm of skin: Secondary | ICD-10-CM | POA: Diagnosis not present

## 2022-09-14 DIAGNOSIS — K219 Gastro-esophageal reflux disease without esophagitis: Secondary | ICD-10-CM | POA: Diagnosis not present

## 2022-09-14 DIAGNOSIS — Z471 Aftercare following joint replacement surgery: Secondary | ICD-10-CM | POA: Diagnosis not present

## 2022-09-14 DIAGNOSIS — F32A Depression, unspecified: Secondary | ICD-10-CM | POA: Diagnosis not present

## 2022-09-14 DIAGNOSIS — Z9181 History of falling: Secondary | ICD-10-CM | POA: Diagnosis not present

## 2022-09-14 DIAGNOSIS — Z8701 Personal history of pneumonia (recurrent): Secondary | ICD-10-CM | POA: Diagnosis not present

## 2022-09-14 DIAGNOSIS — Z96642 Presence of left artificial hip joint: Secondary | ICD-10-CM | POA: Diagnosis not present

## 2022-09-14 DIAGNOSIS — J45909 Unspecified asthma, uncomplicated: Secondary | ICD-10-CM | POA: Diagnosis not present

## 2022-09-14 DIAGNOSIS — Z7982 Long term (current) use of aspirin: Secondary | ICD-10-CM | POA: Diagnosis not present

## 2022-09-14 DIAGNOSIS — F419 Anxiety disorder, unspecified: Secondary | ICD-10-CM | POA: Diagnosis not present

## 2022-09-14 DIAGNOSIS — I10 Essential (primary) hypertension: Secondary | ICD-10-CM | POA: Diagnosis not present

## 2022-09-14 DIAGNOSIS — M19019 Primary osteoarthritis, unspecified shoulder: Secondary | ICD-10-CM | POA: Diagnosis not present

## 2022-09-18 ENCOUNTER — Other Ambulatory Visit: Payer: Self-pay | Admitting: Orthopaedic Surgery

## 2022-09-18 ENCOUNTER — Encounter: Payer: Self-pay | Admitting: Orthopaedic Surgery

## 2022-09-18 MED ORDER — HYDROMORPHONE HCL 4 MG PO TABS: 4.0000 mg | ORAL_TABLET | ORAL | 0 refills | Status: DC | PRN

## 2022-09-18 MED ORDER — COLCHICINE 0.6 MG PO TABS: 0.6000 mg | ORAL_TABLET | Freq: Every day | ORAL | 1 refills | Status: DC

## 2022-09-25 ENCOUNTER — Encounter: Payer: Self-pay | Admitting: Orthopaedic Surgery

## 2022-10-11 ENCOUNTER — Ambulatory Visit (INDEPENDENT_AMBULATORY_CARE_PROVIDER_SITE_OTHER): Payer: BC Managed Care – PPO | Admitting: Orthopaedic Surgery

## 2022-10-11 ENCOUNTER — Encounter: Payer: Self-pay | Admitting: Orthopaedic Surgery

## 2022-10-11 DIAGNOSIS — Z96642 Presence of left artificial hip joint: Secondary | ICD-10-CM

## 2022-10-11 DIAGNOSIS — Z96641 Presence of right artificial hip joint: Secondary | ICD-10-CM

## 2022-10-11 MED ORDER — HYDROCODONE-ACETAMINOPHEN 5-325 MG PO TABS: 1.0000 | ORAL_TABLET | Freq: Three times a day (TID) | ORAL | 0 refills | Status: DC | PRN

## 2022-10-11 NOTE — Progress Notes (Signed)
The patient is a 51 year old who is now status post bilateral hip replacements.  His right hip replaced 6 months ago in his left hip 6 weeks ago.  He says he is doing great overall.  His pain is easing off quite a bit.  He would still like a refill of hydrocodone to take on occasion.  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  He does not walk with an assistive device at all.  On exam his leg lengths are equal.  Both hips have excellent and full range of motion with no issues at all.  He will continue to increase activities as comfort allows.  The next time I need to see him is not for 6 months.  At that visit we will have a standing low AP pelvis and lateral both hips.

## 2022-10-27 ENCOUNTER — Other Ambulatory Visit: Payer: Self-pay | Admitting: Orthopaedic Surgery

## 2022-10-27 MED ORDER — HYDROCODONE-ACETAMINOPHEN 5-325 MG PO TABS: 1.0000 | ORAL_TABLET | Freq: Three times a day (TID) | ORAL | 0 refills | Status: DC | PRN

## 2022-11-17 ENCOUNTER — Other Ambulatory Visit: Payer: Self-pay | Admitting: Orthopaedic Surgery

## 2022-11-17 MED ORDER — HYDROCODONE-ACETAMINOPHEN 5-325 MG PO TABS: 1.0000 | ORAL_TABLET | Freq: Three times a day (TID) | ORAL | 0 refills | Status: DC | PRN

## 2022-12-03 ENCOUNTER — Other Ambulatory Visit: Payer: Self-pay | Admitting: Orthopaedic Surgery

## 2022-12-04 MED ORDER — HYDROCODONE-ACETAMINOPHEN 5-325 MG PO TABS: 1.0000 | ORAL_TABLET | Freq: Three times a day (TID) | ORAL | 0 refills | Status: DC | PRN

## 2022-12-21 ENCOUNTER — Other Ambulatory Visit: Payer: Self-pay | Admitting: Orthopaedic Surgery

## 2022-12-21 MED ORDER — HYDROCODONE-ACETAMINOPHEN 5-325 MG PO TABS: 1.0000 | ORAL_TABLET | Freq: Three times a day (TID) | ORAL | 0 refills | Status: DC | PRN

## 2022-12-21 NOTE — Telephone Encounter (Signed)
Called and advised pt.

## 2023-01-04 ENCOUNTER — Encounter: Payer: Self-pay | Admitting: Radiology

## 2023-02-26 ENCOUNTER — Encounter: Payer: Self-pay | Admitting: Medical

## 2023-02-26 ENCOUNTER — Ambulatory Visit (INDEPENDENT_AMBULATORY_CARE_PROVIDER_SITE_OTHER): Payer: BC Managed Care – PPO | Admitting: Medical

## 2023-02-26 VITALS — BP 112/70 | HR 80 | Temp 98.0°F | Resp 18 | Ht 71.0 in | Wt 202.0 lb

## 2023-02-26 DIAGNOSIS — R252 Cramp and spasm: Secondary | ICD-10-CM

## 2023-02-26 DIAGNOSIS — Z0000 Encounter for general adult medical examination without abnormal findings: Secondary | ICD-10-CM

## 2023-02-26 DIAGNOSIS — Z0001 Encounter for general adult medical examination with abnormal findings: Secondary | ICD-10-CM

## 2023-02-26 DIAGNOSIS — Z125 Encounter for screening for malignant neoplasm of prostate: Secondary | ICD-10-CM

## 2023-02-26 DIAGNOSIS — J309 Allergic rhinitis, unspecified: Secondary | ICD-10-CM | POA: Diagnosis not present

## 2023-02-26 DIAGNOSIS — I10 Essential (primary) hypertension: Secondary | ICD-10-CM | POA: Diagnosis not present

## 2023-02-26 MED ORDER — LEVOCETIRIZINE DIHYDROCHLORIDE 5 MG PO TABS: 5.0000 mg | ORAL_TABLET | Freq: Every evening | ORAL | 11 refills | Status: DC

## 2023-02-26 NOTE — Patient Instructions (Addendum)
For you wellness exam today I have ordered cbc, psa, cmp and lipid. (Explained future lab fasting and avoid alcohol for 7 days prior to lab)  Vaccine given today  Recommend exercise and healthy diet.  We will let you know lab results as they come in.  Follow up date appointment will be determined after lab review.     Htn- bp very well controlled. Check your bp at home. If you seer readings close to 110/80 will decrease losartan to 25 mg daily.  Allergies- rx xyzal antihistamine.  Advised nares clear today. If you get nostril swollen in future try to schedule same day appt to check that day or the following.   Preventive Care 46-68 Years Old, Male Preventive care refers to lifestyle choices and visits with your health care provider that can promote health and wellness. Preventive care visits are also called wellness exams. What can I expect for my preventive care visit? Counseling During your preventive care visit, your health care provider may ask about your: Medical history, including: Past medical problems. Family medical history. Current health, including: Emotional well-being. Home life and relationship well-being. Sexual activity. Lifestyle, including: Alcohol, nicotine or tobacco, and drug use. Access to firearms. Diet, exercise, and sleep habits. Safety issues such as seatbelt and bike helmet use. Sunscreen use. Work and work Astronomer. Physical exam Your health care provider will check your: Height and weight. These may be used to calculate your BMI (body mass index). BMI is a measurement that tells if you are at a healthy weight. Waist circumference. This measures the distance around your waistline. This measurement also tells if you are at a healthy weight and may help predict your risk of certain diseases, such as type 2 diabetes and high blood pressure. Heart rate and blood pressure. Body temperature. Skin for abnormal spots. What immunizations do I  need?  Vaccines are usually given at various ages, according to a schedule. Your health care provider will recommend vaccines for you based on your age, medical history, and lifestyle or other factors, such as travel or where you work. What tests do I need? Screening Your health care provider may recommend screening tests for certain conditions. This may include: Lipid and cholesterol levels. Diabetes screening. This is done by checking your blood sugar (glucose) after you have not eaten for a while (fasting). Hepatitis B test. Hepatitis C test. HIV (human immunodeficiency virus) test. STI (sexually transmitted infection) testing, if you are at risk. Lung cancer screening. Prostate cancer screening. Colorectal cancer screening. Talk with your health care provider about your test results, treatment options, and if necessary, the need for more tests. Follow these instructions at home: Eating and drinking  Eat a diet that includes fresh fruits and vegetables, whole grains, lean protein, and low-fat dairy products. Take vitamin and mineral supplements as recommended by your health care provider. Do not drink alcohol if your health care provider tells you not to drink. If you drink alcohol: Limit how much you have to 0-2 drinks a day. Know how much alcohol is in your drink. In the [place] one drink equals one 12 oz bottle of beer (355 mL), one 5 oz glass of wine (148 mL), or one 1 oz glass of hard liquor (44 mL). Lifestyle Brush your teeth every morning and night with fluoride toothpaste. Floss one time each day. Exercise for at least 30 minutes 5 or more days each week. Do not use any products that contain nicotine or tobacco. These products include cigarettes,  chewing tobacco, and vaping devices, such as e-cigarettes. If you need help quitting, ask your health care provider. Do not use drugs. If you are sexually active, practice safe sex. Use a condom or other form of protection to prevent  STIs. Take aspirin only as told by your health care provider. Make sure that you understand how much to take and what form to take. Work with your health care provider to find out whether it is safe and beneficial for you to take aspirin daily. Find healthy ways to manage stress, such as: Meditation, yoga, or listening to music. Journaling. Talking to a trusted person. Spending time with friends and family. Minimize exposure to UV radiation to reduce your risk of skin cancer. Safety Always wear your seat belt while driving or riding in a vehicle. Do not drive: If you have been drinking alcohol. Do not ride with someone who has been drinking. When you are tired or distracted. While texting. If you have been using any mind-altering substances or drugs. Wear a helmet and other protective equipment during sports activities. If you have firearms in your house, make sure you follow all gun safety procedures. What's next? Go to your health care provider once a year for an annual wellness visit. Ask your health care provider how often you should have your eyes and teeth checked. Stay up to date on all vaccines. This information is not intended to replace advice given to you by your health care provider. Make sure you discuss any questions you have with your health care provider. Document Revised: 04/13/2021 Document Reviewed: 04/13/2021 Elsevier Patient Education  [employer].

## 2023-02-26 NOTE — Progress Notes (Signed)
Subjective:    Patient ID: Christopher Andersen, male    DOB: 1971/09/05, 52 y.o.   MRN: [id number]  HPI Pt is her for wellness exam. Not fasting. He drank 8 beers this past weekend.  Pt works [employer]. Supervisor at call center. Does not exercise regularly. Pt states eats moderate healthy/admits to much fried foods, 2-3 cups of coffee a day, alcohol use about 3-5 beers a day. Divorced. 8 children. Youngest 66 yo. Oldest 29 years old.   Updates he has had hip surgeries. Able to ambulate with no pain.  Recent feels pnd, sneezing  and runny nose this spring*then he explain year round). He notes he is outdoors more and has been cutting grass. As child had severe allergies.  Occasional tender left nostril. Occurs about twice a year.   Review of Systems  Constitutional:  Negative for chills, fatigue and fever.  HENT:  Positive for congestion, postnasal drip and sneezing. Negative for drooling and ear discharge.   Respiratory:  Negative for cough, chest tightness, shortness of breath and wheezing.   Cardiovascular:  Negative for chest pain and palpitations.  Gastrointestinal:  Negative for abdominal pain and blood in stool.  Genitourinary:  Negative for dysuria and flank pain.  Musculoskeletal:  Negative for back pain.  Neurological:  Negative for dizziness, speech difficulty, weakness, numbness and headaches.  Hematological:  Negative for adenopathy. Does not bruise/bleed easily.  Psychiatric/Behavioral:  Negative for behavioral problems and confusion.     Past Medical History:  Diagnosis Date   Anxiety    Arthritis    shoulder and neck   Asthma    as a child   Depression    GERD (gastroesophageal reflux disease)    occasional reflux. treat with OTC meds   Hypertension    Lipoma    removed from right face and current left inguinal   Pneumonia    few times as a child     Social History   Socioeconomic History   Marital status: Divorced    Spouse name: Not on file   Number of  children: 8   Years of education: Not on file   Highest education level: Not on file  Occupational History   Occupation: Supervisor- Call Center  Tobacco Use   Smoking status: Former    Types: Cigarettes   Smokeless tobacco: Former  Building services engineer Use: Never used  Substance and Sexual Activity   Alcohol use: Yes    Comment: 3-5 beers daily.   Drug use: No   Sexual activity: Yes  Other Topics Concern   Not on file  Social History Narrative   Not on file   Social Determinants of Health   Financial Resource Strain: Not on file  Food Insecurity: Not on file  Transportation Needs: Not on file  Physical Activity: Not on file  Stress: Not on file  Social Connections: Not on file  Intimate Partner Violence: Not on file    Past Surgical History:  Procedure Laterality Date   LIPOMA EXCISION  2005   face   TONSILLECTOMY     TOTAL HIP ARTHROPLASTY Right 04/18/2022   Procedure: RIGHT TOTAL HIP ARTHROPLASTY ANTERIOR APPROACH;  Surgeon: Kathryne Hitch, MD;  Location: [facility];  Service: Orthopedics;  Laterality: Right;   TOTAL HIP ARTHROPLASTY Left 08/29/2022   Procedure: LEFT TOTAL HIP ARTHROPLASTY ANTERIOR APPROACH;  Surgeon: Kathryne Hitch, MD;  Location: [facility];  Service: Orthopedics;  Laterality: Left;    Family History  Problem Relation Age of Onset   Dementia Mother    Hypertension Mother    Ulcers Mother    Diabetes Father    Non-Hodgkin's lymphoma Father    Colon cancer Neg Hx    Stomach cancer Neg Hx    Esophageal cancer Neg Hx     No Known Allergies  Current Outpatient Medications on File Prior to Visit  Medication Sig Dispense Refill   calcium carbonate (TUMS - DOSED IN MG ELEMENTAL CALCIUM) 500 MG chewable tablet Chew 2 tablets by mouth daily as needed for indigestion or heartburn.     colchicine 0.6 MG tablet Take 1 tablet (0.6 mg total) by mouth daily. 7 tablet 1   losartan (COZAAR) 50 MG tablet TAKE 1 TABLET BY MOUTH EVERY DAY 90 tablet  1   No current facility-administered medications on file prior to visit.    BP 112/70   Pulse 80   Temp 98 F (36.7 C)   Resp 18   Ht 5\' 11"  (1.803 m)   Wt 202 lb (91.6 kg)   SpO2 100%   BMI 28.17 kg/m         Objective:   Physical Exam  General Mental Status- Alert. General Appearance- Not in acute distress.   Skin General: Color- Normal Color. Moisture- Normal Moisture.  Neck Carotid Arteries- Normal color. Moisture- Normal Moisture. No carotid bruits. No JVD.  Chest and Lung Exam Auscultation: Breath Sounds:-Normal.  Cardiovascular Auscultation:Rythm- Regular. Murmurs & Other Heart Sounds:Auscultation of the heart reveals- No Murmurs.  Abdomen Inspection:-Inspeection Normal. Palpation/Percussion:Note:No mass. Palpation and Percussion of the abdomen reveal- Non Tender, Non Distended + BS, no rebound or guarding.   Neurologic Cranial Nerve exam:- CN III-XII intact(No nystagmus), symmetric smile. Strength:- 5/5 equal and symmetric strength both upper and lower extremities.       Assessment & Plan:   Patient Instructions  For you wellness exam today I have ordered cbc, cmp and lipid.   Vaccine given today  Recommend exercise and healthy diet.  We will let you know lab results as they come in.  Follow up date appointment will be determined after lab review.     Htn- bp very well controlled. Check your bp at home. If you seer readings close to 110/80 will decrease losartan to 25 mg daily.  Allergies- rx xyzal antihistamine.  Advised nares clear today. If you get nostril swollen in future try to schedule same day appt to check that day or the following.     Esperanza Richters, [place]    [id number] charge as addressed htn, allergies and nostril complaint.

## 2023-03-01 ENCOUNTER — Other Ambulatory Visit: Payer: Self-pay | Admitting: Medical

## 2023-03-12 ENCOUNTER — Other Ambulatory Visit (INDEPENDENT_AMBULATORY_CARE_PROVIDER_SITE_OTHER): Payer: BC Managed Care – PPO

## 2023-03-12 DIAGNOSIS — Z125 Encounter for screening for malignant neoplasm of prostate: Secondary | ICD-10-CM

## 2023-03-12 DIAGNOSIS — R252 Cramp and spasm: Secondary | ICD-10-CM | POA: Diagnosis not present

## 2023-03-12 DIAGNOSIS — Z0000 Encounter for general adult medical examination without abnormal findings: Secondary | ICD-10-CM

## 2023-03-12 LAB — LIPID PANEL
Cholesterol: 228 mg/dL — ABNORMAL HIGH (ref 0–200)
HDL: 60.5 mg/dL (ref >39.00)
NonHDL: 167.34
Total CHOL/HDL Ratio: 4
Triglycerides: 203 mg/dL — ABNORMAL HIGH (ref 0.0–149.0)
VLDL: 40.6 mg/dL — ABNORMAL HIGH (ref 0.0–40.0)

## 2023-03-12 LAB — CBC WITH DIFFERENTIAL/PLATELET
Basophils Absolute: 0.1 10*3/uL (ref 0.0–0.1)
Basophils Relative: 1 % (ref 0.0–3.0)
Eosinophils Absolute: 0.2 10*3/uL (ref 0.0–0.7)
Eosinophils Relative: 2.9 % (ref 0.0–5.0)
HCT: 47.9 % (ref 39.0–52.0)
Hemoglobin: 16.2 g/dL (ref 13.0–17.0)
Lymphocytes Relative: 30.6 % (ref 12.0–46.0)
Lymphs Abs: 1.8 10*3/uL (ref 0.7–4.0)
MCHC: 33.7 g/dL (ref 30.0–36.0)
MCV: 94.6 fl (ref 78.0–100.0)
Monocytes Absolute: 0.6 10*3/uL (ref 0.1–1.0)
Monocytes Relative: 10.6 % (ref 3.0–12.0)
Neutro Abs: 3.3 10*3/uL (ref 1.4–7.7)
Neutrophils Relative %: 54.9 % (ref 43.0–77.0)
Platelets: 225 10*3/uL (ref 150.0–400.0)
RBC: 5.06 Mil/uL (ref 4.22–5.81)
RDW: 13.5 % (ref 11.5–15.5)
WBC: 6 10*3/uL (ref 4.0–10.5)

## 2023-03-12 LAB — COMPREHENSIVE METABOLIC PANEL WITH GFR
ALT: 32 U/L (ref 0–53)
AST: 27 U/L (ref 0–37)
Albumin: 4.7 g/dL (ref 3.5–5.2)
Alkaline Phosphatase: 43 U/L (ref 39–117)
BUN: 19 mg/dL (ref 6–23)
CO2: 28 meq/L (ref 19–32)
Calcium: 10.4 mg/dL (ref 8.4–10.5)
Chloride: 99 meq/L (ref 96–112)
Creatinine, Ser: 1.15 mg/dL (ref 0.40–1.50)
GFR: 73.84 mL/min
Glucose, Bld: 88 mg/dL (ref 70–99)
Potassium: 4.8 meq/L (ref 3.5–5.1)
Sodium: 136 meq/L (ref 135–145)
Total Bilirubin: 0.6 mg/dL (ref 0.2–1.2)
Total Protein: 7.6 g/dL (ref 6.0–8.3)

## 2023-03-12 LAB — MAGNESIUM: Magnesium: 2.1 mg/dL (ref 1.5–2.5)

## 2023-03-12 LAB — PSA: PSA: 1.75 ng/mL (ref 0.10–4.00)

## 2023-03-12 LAB — LDL CHOLESTEROL, DIRECT: Direct LDL: 139 mg/dL

## 2023-04-12 ENCOUNTER — Ambulatory Visit (INDEPENDENT_AMBULATORY_CARE_PROVIDER_SITE_OTHER): Payer: BC Managed Care – PPO | Admitting: Orthopaedic Surgery

## 2023-04-12 ENCOUNTER — Encounter: Payer: Self-pay | Admitting: Orthopaedic Surgery

## 2023-04-12 ENCOUNTER — Other Ambulatory Visit (INDEPENDENT_AMBULATORY_CARE_PROVIDER_SITE_OTHER): Payer: BC Managed Care – PPO

## 2023-04-12 DIAGNOSIS — Z96643 Presence of artificial hip joint, bilateral: Secondary | ICD-10-CM

## 2023-04-12 NOTE — Progress Notes (Signed)
The patient is now a year status post a right total hip arthroplasty in about 8 months status post a left hip replacement.  These were done secondary to avascular porosis.  He is 52 years old.  He says he is doing great.  He is a very active and athletic 52 year old who would like to get back into some running.  He understands that that can put more wear on his hip joints in terms of the prosthesis.  His leg lengths are equal.  He walks without a limp.  He has excellent range of motion of both hips.  An AP pelvis and lateral both hips shows well seated bone ingrown implants.  At this standpoint he can run but I want him to be cautious about it.  All questions and concerns were answered and addressed.  Follow-up can be as needed.  If he does develop any issues with his hips or anything else orthopedic he is knows to come see [place].

## 2023-07-27 ENCOUNTER — Ambulatory Visit: Payer: BC Managed Care – PPO | Admitting: Medical

## 2023-08-03 ENCOUNTER — Ambulatory Visit (INDEPENDENT_AMBULATORY_CARE_PROVIDER_SITE_OTHER): Payer: BC Managed Care – PPO | Admitting: Medical

## 2023-08-03 VITALS — BP 118/73 | HR 73 | Temp 98.5°F | Resp 16 | Ht 71.0 in | Wt 197.4 lb

## 2023-08-03 DIAGNOSIS — R0982 Postnasal drip: Secondary | ICD-10-CM

## 2023-08-03 DIAGNOSIS — J309 Allergic rhinitis, unspecified: Secondary | ICD-10-CM | POA: Diagnosis not present

## 2023-08-03 DIAGNOSIS — I10 Essential (primary) hypertension: Secondary | ICD-10-CM | POA: Diagnosis not present

## 2023-08-03 MED ORDER — AZELASTINE HCL 0.1 % NA SOLN: 2.0000 | Freq: Two times a day (BID) | NASAL | 1 refills | Status: AC

## 2023-08-03 MED ORDER — LORATADINE 10 MG PO TABS: 10.0000 mg | ORAL_TABLET | Freq: Every day | ORAL | 0 refills | Status: DC

## 2023-08-03 NOTE — Progress Notes (Signed)
Subjective:    Patient ID: Christopher Andersen, male    DOB: 1971/08/10, 52 y.o.   MRN: [id number]  HPI Pt in for follow up.  Pt states he has constant nasal drainage. Pt thinks does happen more at work. He thinks something exposed to at work. Pt was concerned about zyrtec drowsiness. Flonase in the morning did help. Pt has concern about drowsins side effects of antihistamine.   Nose bleed in [place] about 3 weeks ago then resolved quickly within 5-10 mintues  Htn- blood pressure controlled today. He is back on medication losartan.      Review of Systems  Constitutional:  Negative for chills, fatigue and fever.  HENT:  Positive for congestion and postnasal drip.   Respiratory:  Negative for cough, chest tightness and wheezing.   Cardiovascular:  Negative for chest pain and palpitations.  Gastrointestinal:  Negative for abdominal pain.  Musculoskeletal:  Negative for back pain, gait problem and joint swelling.  Neurological:  Negative for dizziness, facial asymmetry and headaches.  Hematological:  Negative for adenopathy. Does not bruise/bleed easily.  Psychiatric/Behavioral:  Negative for behavioral problems and confusion.     Past Medical History:  Diagnosis Date   Anxiety    Arthritis    shoulder and neck   Asthma    as a child   Depression    GERD (gastroesophageal reflux disease)    occasional reflux. treat with OTC meds   Hypertension    Lipoma    removed from right face and current left inguinal   Pneumonia    few times as a child     Social History   Socioeconomic History   Marital status: Divorced    Spouse name: Not on file   Number of children: 8   Years of education: Not on file   Highest education level: Not on file  Occupational History   Occupation: Supervisor- Call Center  Tobacco Use   Smoking status: Former    Types: Cigarettes   Smokeless tobacco: Former  Building services engineer status: Never Used  Substance and Sexual Activity   Alcohol  use: Yes    Comment: 3-5 beers daily.   Drug use: No   Sexual activity: Yes  Other Topics Concern   Not on file  Social History Narrative   Not on file   Social Determinants of Health   Financial Resource Strain: Not on file  Food Insecurity: Not on file  Transportation Needs: Not on file  Physical Activity: Not on file  Stress: Not on file  Social Connections: Not on file  Intimate Partner Violence: Not on file    Past Surgical History:  Procedure Laterality Date   LIPOMA EXCISION  2005   face   TONSILLECTOMY     TOTAL HIP ARTHROPLASTY Right 04/18/2022   Procedure: RIGHT TOTAL HIP ARTHROPLASTY ANTERIOR APPROACH;  Surgeon: Kathryne Hitch, MD;  Location: [facility];  Service: Orthopedics;  Laterality: Right;   TOTAL HIP ARTHROPLASTY Left 08/29/2022   Procedure: LEFT TOTAL HIP ARTHROPLASTY ANTERIOR APPROACH;  Surgeon: Kathryne Hitch, MD;  Location: [facility];  Service: Orthopedics;  Laterality: Left;    Family History  Problem Relation Age of Onset   Dementia Mother    Hypertension Mother    Ulcers Mother    Diabetes Father    Non-Hodgkin's lymphoma Father    Colon cancer Neg Hx    Stomach cancer Neg Hx    Esophageal cancer Neg Hx  No Known Allergies  Current Outpatient Medications on File Prior to Visit  Medication Sig Dispense Refill   calcium carbonate (TUMS - DOSED IN MG ELEMENTAL CALCIUM) 500 MG chewable tablet Chew 2 tablets by mouth daily as needed for indigestion or heartburn.     losartan (COZAAR) 50 MG tablet TAKE 1 TABLET BY MOUTH EVERY DAY 90 tablet 1   No current facility-administered medications on file prior to visit.    BP 118/73 (BP Location: Left Arm, Patient Position: Sitting, Cuff Size: Normal)   Pulse 73   Temp 98.5 F (36.9 C) (Oral)   Resp 16   Ht 5\' 11"  (1.803 m)   Wt 197 lb 6.4 oz (89.5 kg)   SpO2 100%   BMI 27.53 kg/m        Objective:   Physical Exam  General Mental Status- Alert. General Appearance- Not in  acute distress.   Skin General: Color- Normal Color. Moisture- Normal Moisture.  Neck Carotid Arteries- Normal color. Moisture- Normal Moisture. No carotid bruits. No JVD.  Chest and Lung Exam Auscultation: Breath Sounds:-Normal.  Cardiovascular Auscultation:Rythm- Regular. Murmurs & Other Heart Sounds:Auscultation of the heart reveals- No Murmurs.  Abdomen Inspection:-Inspeection Normal. Palpation/Percussion:Note:No mass. Palpation and Percussion of the abdomen reveal- Non Tender, Non Distended + BS, no rebound or guarding.  Neurologic Cranial Nerve exam:- CN III-XII intact(No nystagmus), symmetric smile. Strength:- 5/5 equal and symmetric strength both upper and lower extremities.    Heent- boggy tubinates. Pnd. No tonsil hypertrophy    Assessment & Plan:   Patient Instructions  1. Allergic rhinitis, unspecified seasonality, unspecified trigger -rx claritin 10 mg daily. Try this for pnd first and add on astelin if needed. -if sign/symptoms persist consider referral to allergist. -dc zyrtec and flonase.  2. PND (post-nasal drip) Sane as above  3. Hypertension, unspecified type -bp controlled. Continue losartan daily.  If nose bleeds reoccur let me know.  Update me on how your are doing in 2 weeks by my chart.        Esperanza Richters, PA-C

## 2023-08-03 NOTE — Patient Instructions (Addendum)
1. Allergic rhinitis, unspecified seasonality, unspecified trigger -rx claritin 10 mg daily. Try this for pnd first and add on astelin if needed. -if sign/symptoms persist consider referral to allergist. -dc zyrtec and flonase.  2. PND (post-nasal drip) Sane as above  3. Hypertension, unspecified type -bp controlled. Continue losartan daily.  If nose bleeds reoccur let me know.  Update me on how your are doing in 2 weeks by my chart.

## 2023-08-22 ENCOUNTER — Encounter: Payer: Self-pay | Admitting: Medical

## 2023-08-23 MED ORDER — LORATADINE 10 MG PO TABS: 10.0000 mg | ORAL_TABLET | Freq: Every day | ORAL | 3 refills | Status: AC

## 2023-08-23 NOTE — Addendum Note (Signed)
Addended by: Gwenevere Abbot on: 08/23/2023 03:18 PM   Modules accepted: Orders

## 2023-09-10 ENCOUNTER — Other Ambulatory Visit: Payer: Self-pay | Admitting: Medical

## 2023-10-17 ENCOUNTER — Ambulatory Visit: Payer: Self-pay | Admitting: Internal Medicine

## 2023-11-13 NOTE — Progress Notes (Signed)
 NEW PATIENT Date of Service/Encounter:  11/14/23 Referring provider: Francine Iron Primary care provider: Francine Iron  Subjective:  Christopher Andersen is a 53 y.o. male with a PMHx of HTN, s/p replacement of bilateral hips presenting today for evaluation of chronic rhinitis. History obtained from: chart review and patient.   Discussed the use of AI scribe software for clinical note transcription with the patient, who gave verbal consent to proceed.  History of Present Illness   The patient, with a history of chronic throat irritation and nasal symptoms, presents with concerns of a potential cat allergy . He reports a longstanding issue of an itchy throat, which predates the acquisition of his cats a year ago. The patient describes the throat itch as overwhelming and unrelieved by coughing, necessitating frequent use of throat spray and chloraseptic lozenges. 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  The patient recalls having undergone allergy  testing as a young child, with cats among the identified allergens. However, he notes a significant improvement in his tolerance to cats since then. Despite the potential exacerbation of his symptoms, the patient expresses a strong desire to keep his cats.  In his childhood, the patient had frequent hospitalizations due to lung issues and recurrent pneumonia. He was on an oral steroid inhaler for many years, which he believes contributed to the development of avascular necrosis in his hips, necessitating hip replacement surgeries. He has not used an inhaler since his teenage years. He denies current asthma symptoms.   The patient has been managing his symptoms with loratadine  and Flonase nasal spray, using the latter intermittently when sneezing becomes overwhelming. He expresses dissatisfaction with the effectiveness of these medications, particularly in controlling  the nasal drip. He recalls trying azelastine  fluticasone  nasal spray in the past but discontinued it due to its unpleasant taste and perceived lack of benefit.      Chart Review:  Reviewed PCP notes from referral 08/03/23: " Allergic rhinitis, unspecified seasonality, unspecified trigger -rx claritin  10 mg daily. Try this for pnd first and add on astelin  if needed. -if sign/symptoms persist consider referral to allergist. -dc zyrtec and flonase"   Past Medical History: Past Medical History:  Diagnosis Date   Anxiety    Arthritis    shoulder and neck   Asthma    as a child   Depression    GERD (gastroesophageal reflux disease)    occasional reflux. treat with OTC meds   Hypertension    Lipoma    removed from right face and current left inguinal   Pneumonia    few times as a child   Medication List:  Current Outpatient Medications  Medication Sig Dispense Refill   azelastine  (ASTELIN ) 0.1 % nasal spray Place 2 sprays into both nostrils 2 (two) times daily. Use in each nostril as directed 30 mL 1   calcium  carbonate (TUMS - DOSED IN MG ELEMENTAL CALCIUM ) 500 MG chewable tablet Chew 2 tablets by mouth daily as needed for indigestion or heartburn.     loratadine  (CLARITIN ) 10 MG tablet Take 1 tablet (10 mg total) by mouth daily. 30 tablet 3   losartan  (COZAAR ) 50 MG tablet TAKE 1 TABLET DAILY 90 tablet 0   No current facility-administered medications for this visit.   Known Allergies:  No Known Allergies Past Surgical History: Past Surgical History:  Procedure Laterality Date   LIPOMA EXCISION  2005   face  TONSILLECTOMY  1988   TOTAL HIP ARTHROPLASTY Right 04/18/2022   Procedure: RIGHT TOTAL HIP ARTHROPLASTY ANTERIOR APPROACH;  Surgeon: Arnie Lao, MD;  Location: [facility];  Service: Orthopedics;  Laterality: Right;   TOTAL HIP ARTHROPLASTY Left 08/29/2022   Procedure: LEFT TOTAL HIP ARTHROPLASTY ANTERIOR APPROACH;  Surgeon: Arnie Lao, MD;   Location: [facility];  Service: Orthopedics;  Laterality: Left;   Family History: Family History  Problem Relation Age of Onset   Dementia Mother    Hypertension Mother    Ulcers Mother    Diabetes Father    Non-Hodgkin's lymphoma Father    Colon cancer Neg Hx    Stomach cancer Neg Hx    Esophageal cancer Neg Hx    Social History: Christopher Andersen lives in a house that he brings, no water damage, carpet in the bedroom, gas eating, he pumps, 3 indoor cats, no roaches, using dust mite covers on the bed and the pillows, no smoke exposure.  Works as a Associate Professor.  No HEPA filter in the home.  [place]  He is not exposed to fumes chemicals or dust at work or at home.   ROS:  All other systems negative except as noted per HPI.  Objective:  Blood pressure 126/74, pulse 87, temperature 98.7 F (37.1 C), temperature source Temporal, resp. rate 20, height 5' 11.5" (1.816 m), weight 204 lb 12.8 oz (92.9 kg), SpO2 100%. Body mass index is 28.17 kg/m. Physical Exam:  General Appearance:  Alert, cooperative, no distress, appears stated age  Head:  Normocephalic, without obvious abnormality, atraumatic  Eyes:  Conjunctiva clear, EOM's intact  Ears EACs normal bilaterally and normal TMs bilaterally  Nose: Nares normal, hypertrophic turbinates, normal mucosa, and no visible anterior polyps  Throat: Lips, tongue normal; teeth and gums normal, normal posterior oropharynx  Neck: Supple, symmetrical  Lungs:   clear to auscultation bilaterally, Respirations unlabored, no coughing  Heart:  regular rate and rhythm and no murmur, Appears well perfused  Extremities: No edema  Skin: Skin color, texture, turgor normal and no rashes or lesions on visualized portions of skin  Neurologic: No gross deficits   Diagnostics: Labs:  Lab Orders  No laboratory test(s) ordered today     Assessment and Plan  Assessment and Plan    Chronic Rhinitis Longstanding symptoms  of nasal congestion, postnasal drip, and throat irritation. Possible allergic etiology, but nonallergic causes also considered. Current use of loratadine  and Flonase with limited benefit.Recent exposure to cats, but symptoms predate this exposure. History of childhood asthma and allergy  testing. -Discontinue Flonase. - Use saline nasal spray such as simply saline, etc. prior to medicated nasal sprays -Start Dymista  (azelastine /fluticasone ) nasal spray, 1 spray up to twice daily as needed for drainage, congestion, sneezing. -Start Atrovent  nasal spray, 1 spray in each nostril up to three times daily, to reduce nasal drainage.  Caution as this may cause nasal mucosa to become overly dry.  If this occurs use less frequently. -Encourage increased hydration, particularly due to patient's occupation requiring frequent talking.  -Schedule allergy  testing for 11/23/2023 at 1:30 PM. -Stop loratadine  and dymista  nasal spray three days prior to allergy  testing. -Discuss potential for allergy  shots if testing positive for relevant allergens.  General Health Maintenance -Encourage use of a hydration tracking app or method to ensure adequate daily water intake  Remote history of asthma - has not required inhalers in many years and denies current symptoms of shortness of breath  or trouble breathing  History of Avascular necrosis -suspected related to prior inhaled corticosteroid use -s/p bilateral hip replacements.  Follow up : Friday January 24th at 1: 30 PM. -Stop loratadine  and dymista  nasal spray three days prior to allergy  testing. It was a pleasure meeting you in clinic today! Thank you for allowing me to participate in your care.  Jonathon Neighbors, MD [facility]     This note in its entirety was forwarded to the Provider who requested this consultation.  Other: none  Thank you for your kind referral. I appreciate the opportunity to take part in Yerachmiel's care. Please do not  hesitate to contact me with questions.  Sincerely,  Jonathon Neighbors, MD [facility] 

## 2023-11-14 ENCOUNTER — Other Ambulatory Visit: Payer: Self-pay

## 2023-11-14 ENCOUNTER — Encounter: Payer: Self-pay | Admitting: Internal Medicine

## 2023-11-14 ENCOUNTER — Ambulatory Visit (INDEPENDENT_AMBULATORY_CARE_PROVIDER_SITE_OTHER): Payer: BC Managed Care – PPO | Admitting: Internal Medicine

## 2023-11-14 VITALS — BP 126/74 | HR 87 | Temp 98.7°F | Resp 20 | Ht 71.5 in | Wt 204.8 lb

## 2023-11-14 DIAGNOSIS — M8710 Osteonecrosis due to drugs, unspecified bone: Secondary | ICD-10-CM | POA: Diagnosis not present

## 2023-11-14 DIAGNOSIS — J310 Chronic rhinitis: Secondary | ICD-10-CM | POA: Insufficient documentation

## 2023-11-14 DIAGNOSIS — J45909 Unspecified asthma, uncomplicated: Secondary | ICD-10-CM | POA: Diagnosis not present

## 2023-11-14 DIAGNOSIS — Z888 Allergy status to other drugs, medicaments and biological substances status: Secondary | ICD-10-CM | POA: Diagnosis not present

## 2023-11-14 MED ORDER — AZELASTINE-FLUTICASONE 137-50 MCG/ACT NA SUSP: 1.0000 | Freq: Two times a day (BID) | NASAL | 5 refills | Status: DC | PRN

## 2023-11-14 MED ORDER — IPRATROPIUM BROMIDE 0.03 % NA SOLN: 1.0000 | Freq: Three times a day (TID) | NASAL | 5 refills | Status: AC | PRN

## 2023-11-14 NOTE — Patient Instructions (Signed)
 Chronic Rhinitis Longstanding symptoms of nasal congestion, postnasal drip, and throat irritation. Possible allergic etiology, but nonallergic causes also considered. Current use of loratadine  and Flonase with limited benefit.Recent exposure to cats, but symptoms predate this exposure. History of childhood asthma and allergy  testing. -Discontinue Flonase. - Use saline nasal spray such as simply saline, etc. prior to medicated nasal sprays -Start Dymista  (azelastine /fluticasone ) nasal spray, 1 spray up to twice daily as needed for drainage, congestion, sneezing. -Start Atrovent  nasal spray, 1 spray in each nostril up to three times daily, to reduce nasal drainage.  Caution as this may cause nasal mucosa to become overly dry.  If this occurs use less frequently. -Encourage increased hydration, particularly due to patient's occupation requiring frequent talking.  -Schedule allergy  testing for 11/23/2023 at 1:30 PM. -Stop loratadine  and dymista  nasal spray three days prior to allergy  testing. -Discuss potential for allergy  shots if testing positive for relevant allergens.  General Health Maintenance -Encourage use of a hydration tracking app or method to ensure adequate daily water intake  Remote history of asthma - has not required inhalers in many years and denies current symptoms of shortness of breath or trouble breathing  History of Avascular necrosis -suspected related to prior inhaled corticosteroid use -s/p bilateral hip replacements.  Follow up : Friday January 24th at 1: 30 PM. -Stop loratadine  and dymista  nasal spray three days prior to allergy  testing. It was a pleasure meeting you in clinic today! Thank you for allowing me to participate in your care.  Jonathon Neighbors, MD [facility]

## 2023-11-22 NOTE — Progress Notes (Signed)
Date of Service/Encounter:  11/23/23  Allergy testing appointment   Initial visit on 11/14/23, seen for chronic rhinitis, remote history of asthma.  Please see that note for additional details.  Today reports for allergy diagnostic testing:    DIAGNOSTICS:  Skin Testing: Environmental allergy panel. Adequate positive and negative controls. Results discussed with patient/family.  Airborne Adult Perc - 11/23/23 1338     Time Antigen Placed 1338    Allergen Manufacturer Waynette Buttery    Location Back    Number of Test 55    1. Control-Buffer 50% Glycerol Negative    2. Control-Histamine 3+    3. Bahia Negative    4. French Southern Territories Negative    5. Johnson Negative    6. Kentucky Blue Negative    7. Meadow Fescue Negative    8. Perennial Rye Negative    9. Timothy Negative    10. Ragweed Mix Negative    11. Cocklebur Negative    12. Plantain,  English Negative    13. Baccharis Negative    14. Dog Fennel Negative    15. Guernsey Thistle 2+    16. Lamb's Quarters Negative    17. Sheep Sorrell 2+    18. Rough Pigweed Negative    19. Marsh Elder, Rough 2+    20. Mugwort, Common 2+    21. Box, Elder Negative    22. Cedar, red Negative    23. Sweet Gum Negative    24. Pecan Pollen Negative    25. Pine Mix Negative    26. Walnut, Black Pollen Negative    27. Red Mulberry Negative    28. Ash Mix Negative    29. Birch Mix Negative    30. Beech American Negative    31. Cottonwood, [place] Negative    32. Hickory, White Negative    33. Maple Mix 3+    34. Oak, [place] Mix 3+    35. Sycamore Eastern Negative    36. Alternaria Alternata Negative    37. Cladosporium Herbarum Negative    38. Aspergillus Mix Negative    39. Penicillium Mix Negative    40. Bipolaris Sorokiniana (Helminthosporium) Negative    41. Drechslera Spicifera (Curvularia) Negative    42. Mucor Plumbeus Negative    43. Fusarium Moniliforme Negative    44. Aureobasidium Pullulans (pullulara) Negative    45. Rhizopus  Oryzae Negative    46. Botrytis Cinera Negative    47. Epicoccum Nigrum Negative    48. Phoma Betae Negative    49. Dust Mite Mix 4+    50. Cat Hair 10,000 BAU/ml 3+    51.  Dog Epithelia Negative    52. Mixed Feathers Negative    53. Horse Epithelia Negative    54. Cockroach, German 3+    55. Tobacco Leaf Negative             Intradermal - 11/23/23 1412     Time Antigen Placed 0210    Location Arm    Number of Test 11    Control 3+    Bahia Negative    French Southern Territories Negative    Johnson Negative    7 Grass Negative    Ragweed Mix Negative    Mold 1 Negative    Mold 2 Negative    Mold 3 Negative    Mold 4 Negative    Dog Negative             Allergy testing results were read and interpreted by myself, documented  by clinical staff.  Patient provided with copy of allergy testing along with avoidance measures when indicated.  Tonny Bollman, MD  [facility]

## 2023-11-23 ENCOUNTER — Encounter: Payer: Self-pay | Admitting: Internal Medicine

## 2023-11-23 ENCOUNTER — Ambulatory Visit (INDEPENDENT_AMBULATORY_CARE_PROVIDER_SITE_OTHER): Payer: BC Managed Care – PPO | Admitting: Internal Medicine

## 2023-11-23 DIAGNOSIS — J3089 Other allergic rhinitis: Secondary | ICD-10-CM | POA: Diagnosis not present

## 2023-11-23 DIAGNOSIS — J302 Other seasonal allergic rhinitis: Secondary | ICD-10-CM | POA: Diagnosis not present

## 2023-11-23 MED ORDER — RYALTRIS 665-25 MCG/ACT NA SUSP: 2.0000 | Freq: Two times a day (BID) | NASAL | 5 refills | Status: AC | PRN

## 2023-11-23 NOTE — Patient Instructions (Addendum)
Chronic Rhinitis Longstanding symptoms of nasal congestion, postnasal drip, and throat irritation. Possible allergic etiology, but nonallergic causes also considered. Current use of loratadine and Flonase with limited benefit.Recent exposure to cats, but symptoms predate this exposure. History of childhood asthma and allergy testing. -Discontinue Flonase. - Use saline nasal spray such as simply saline, etc. prior to medicated nasal sprays -Start Ryaltris nasal spray, 1 spray up to twice daily as needed for drainage, congestion, sneezing. This will replace Dymista (fluticasone + azelastine)-you will receive a text from [facility] in [facility] Atrovent nasal spray, 1 spray in each nostril up to three times daily, to reduce nasal drainage.  Caution as this may cause nasal mucosa to become overly dry.  If this occurs use less frequently. -Encourage increased hydration, particularly due to patient's occupation requiring frequent talking.  -allergy testing today showed positive to cat, dust mites, cockroach, tree and weed pollen. ID's positive to ragweed mi -Consider allergy injections to reduce lifetime symptoms and need for medications by teaching your immune system to become tolerant of the environmental allergens you are allergic to Information provided, consent signed.   Remote history of asthma - has not required inhalers in many years and denies current symptoms of shortness of breath or trouble breathing  History of Avascular necrosis -suspected related to prior inhaled corticosteroid use -s/p bilateral hip replacements.  Follow up : 3-4 months, sooner if needed.  It was a pleasure seeing you again in clinic today! Thank you for allowing me to participate in your care.  Tonny Bollman, MD [facility]   DUST MITE AVOIDANCE MEASURES:  There are three main measures that need and can be taken to avoid house dust mites:  Reduce accumulation of dust in  general -reduce furniture, clothing, carpeting, books, stuffed animals, especially in bedroom  Separate yourself from the dust -use pillow and mattress encasements (can be found at stores such as [employer] or online) -avoid direct exposure to air condition flow -use a HEPA filter device, especially in the bedroom; you can also use a HEPA filter vacuum cleaner -wipe dust with a moist towel instead of a dry towel or broom when cleaning  Decrease mites and/or their secretions -wash clothing and linen and stuffed animals at highest temperature possible, at least every 2 weeks -stuffed animals can also be placed in a bag and put in a freezer overnight  Despite the above measures, it is impossible to eliminate dust mites or their allergen completely from your home.  With the above measures the burden of mites in your home can be diminished, with the goal of minimizing your allergic symptoms.  Success will be reached only when implementing and using all means together. Control of Dog or Cat Allergen  Avoidance is the best way to manage a dog or cat allergy. If you have a dog or cat and are allergic to dog or cats, consider removing the dog or cat from the home. If you have a dog or cat but don't want to find it a new home, or if your family wants a pet even though someone in the household is allergic, here are some strategies that may help keep symptoms at bay:  Keep the pet out of your bedroom and restrict it to only a few rooms. Be advised that keeping the dog or cat in only one room will not limit the allergens to that room. Don't pet, hug or kiss the dog or cat; if  you do, wash your hands with soap and water. High-efficiency particulate air (HEPA) cleaners run continuously in a bedroom or living room can reduce allergen levels over time. Regular use of a high-efficiency vacuum cleaner or a central vacuum can reduce allergen levels. Giving your dog or cat a bath at least once a week  can reduce airborne allergen. Reducing Pollen Exposure  The American Academy of Allergy, Asthma and Immunology suggests the following steps to reduce your exposure to pollen during allergy seasons.    Do not hang sheets or clothing out to dry; pollen may collect on these items. Do not mow lawns or spend time around freshly cut grass; mowing stirs up pollen. Keep windows closed at night.  Keep car windows closed while driving. Minimize morning activities outdoors, a time when pollen counts are usually at their highest. Stay indoors as much as possible when pollen counts or humidity is high and on windy days when pollen tends to remain in the air longer. Use air conditioning when possible.  Many air conditioners have filters that trap the pollen spores. Use a HEPA room air filter to remove pollen form the indoor air you breathe. Control of Cockroach Allergen  Cockroach allergen has been identified as an important cause of acute attacks of asthma, especially in urban settings.  There are fifty-five species of cockroach that exist in the [place], however only three, the [place], [place] species produce allergen that can affect patients with Asthma.  Allergens can be obtained from fecal particles, egg casings and secretions from cockroaches.    Remove food sources. Reduce access to water. Seal access and entry points. Spray runways with 0.5-1% Diazinon or Chlorpyrifos Blow boric acid power under stoves and refrigerator. Place bait stations (hydramethylnon) at feeding sites.

## 2023-11-30 ENCOUNTER — Telehealth: Payer: Self-pay

## 2023-11-30 ENCOUNTER — Encounter: Payer: Self-pay | Admitting: Medical

## 2023-11-30 ENCOUNTER — Ambulatory Visit (INDEPENDENT_AMBULATORY_CARE_PROVIDER_SITE_OTHER): Payer: BC Managed Care – PPO | Admitting: Medical

## 2023-11-30 VITALS — BP 132/86 | HR 86 | Temp 98.2°F | Resp 18 | Ht 71.0 in | Wt 205.0 lb

## 2023-11-30 DIAGNOSIS — D179 Benign lipomatous neoplasm, unspecified: Secondary | ICD-10-CM

## 2023-11-30 DIAGNOSIS — N529 Male erectile dysfunction, unspecified: Secondary | ICD-10-CM | POA: Diagnosis not present

## 2023-11-30 DIAGNOSIS — J342 Deviated nasal septum: Secondary | ICD-10-CM | POA: Diagnosis not present

## 2023-11-30 DIAGNOSIS — J3089 Other allergic rhinitis: Secondary | ICD-10-CM

## 2023-11-30 DIAGNOSIS — J302 Other seasonal allergic rhinitis: Secondary | ICD-10-CM

## 2023-11-30 MED ORDER — SILDENAFIL CITRATE 50 MG PO TABS
50.0000 mg | ORAL_TABLET | Freq: Every day | ORAL | 0 refills | Status: DC | PRN
Start: 2023-11-30 — End: 2023-12-14

## 2023-11-30 NOTE — Telephone Encounter (Signed)
PA initiated via Covermymeds; KEY: [id number].  PA approved. Effective 11/30/2023 to 11/29/2026

## 2023-11-30 NOTE — Progress Notes (Signed)
Subjective:    Patient ID: Christopher Andersen, male    DOB: 03/07/1971, 53 y.o.   MRN: [id number]  Discussed the use of AI scribe software for clinical note transcription with the patient, who gave verbal consent to proceed.  History of Present Illness   The patient presents with erectile dysfunction and a palpable lump in the left anterior hip inguinal region.  He has been experiencing erectile dysfunction for over a year, characterized by difficulty maintaining a full erection. It is rare for him to wake up with a full erection. He is currently taking medication for high blood pressure.  He has a palpable lump in the left anterior hip inguinal region, which he believes might be a lipoma, as he has had similar lumps before. An ultrasound identified a 2.8 x 0.7 x 2.8 cm subcutaneous mass in the area, suggesting fatty tissue, likely a lipoma. An MRI did not provide definitive results, but the lump is palpable. He is concerned about the lump's potential to increase in size and cause pain. Actually has been more obvious now.  He has a history of nasal issues, including a deviated septum, which significantly impacts his breathing, particularly through the right nostril.    also allergic rhinitis history. Seeing allergist. He has been prescribed various nasal sprays, including Dymista and Astelin, to manage his symptoms. He finds Astelin most effective, although it dries out his nose, which he manages with saline sprays and rinses. He has allergies to dog, cockroach, and dust mites but is hesitant about allergy shots due to cost and the presence of his cats.   RADIOLOGY Ultrasound: 2.8 x 0.7 x 2.8 cm subcutaneous mass at the anterior left palpable lump. Characteristics suggest fatty tissue. In the absence of a visual abdominal wall defect, most likely a lipoma. MRI: No underlying soft tissue mass, although the lump is palpable.         Review of Systems See hpi    Objective:   Physical  Exam  General Mental Status- Alert. General Appearance- Not in acute distress.    Neck Carotid Arteries- Normal color. Moisture- Normal Moisture. No carotid bruits. No JVD.  Chest and Lung Exam Auscultation: Breath Sounds:-Normal.  Cardiovascular Auscultation:Rythm- Regular. Murmurs & Other Heart Sounds:Auscultation of the heart reveals- No Murmurs.  Abdomen Inspection:-Inspeection Normal. Palpation/Percussion:Note:No mass. Palpation and Percussion of the abdomen reveal- Non Tender, Non Distended + BS, no rebound or guarding. -left lower abdomen just above the level of the hip 2.5 cm approximate shaped probable lipoma. This area is not tender to palpation. Surround skin normal coloration.  Neurologic Cranial Nerve exam:- CN III-XII intact(No nystagmus), symmetric smile. Strength:- 5/5 equal and symmetric strength both upper and lower extremities.   Heent- on inspection of rt nares moderate to severe deviated septum    Assessment & Plan:  Assessment and Plan    Erectile Dysfunction Difficulty maintaining a full erection for over a year. Discussed the benefits and risks of Viagra. -Prescribe Viagra 50mg , to be taken 30-60 minutes prior to sexual activity. Max once daily. -Provide 10 tablets initially and assess cost and insurance limitations.  Subcutaneous Mass (likely Lipoma) Palpable mass in the left anterior hip/inguinal region, increasing in size. No pain reported. Ultrasound suggests fatty tissue, likely lipoma. MRI inconclusive. -Refer to a general surgeon for further evaluation and potential removal.  Allergic Rhinitis Multiple allergies identified, including dog, cockroach, and dust mites. Current treatment with Astelin nasal spray, which is effective but causes dryness. -Continue Astelin nasal spray  and saline sprays for symptom management.  Deviated Nasal Septum Significant deviation to the right, causing difficulty with airflow. -Consider referral to an ENT  specialist for potential surgical correction when patient is ready.  General Health Maintenance / Followup Plans -Schedule standard wellness exam with fasting labs in late April or May.       Esperanza Richters, PA-C   Time spent with patient today was  42 minutes which consisted of chart revdiew, discussing diagnosis, work up treatment and documentation.

## 2023-11-30 NOTE — Patient Instructions (Signed)
Erectile Dysfunction Difficulty maintaining a full erection for over a year. Discussed the benefits and risks of Viagra. -Prescribe Viagra 50mg , to be taken 30-60 minutes prior to sexual activity. Max once daily. -Provide 10 tablets initially and assess cost and insurance limitations.  Subcutaneous Mass (likely Lipoma) Palpable mass in the left anterior hip/inguinal region, increasing in size. No pain reported. Ultrasound suggests fatty tissue, likely lipoma. MRI inconclusive. -Refer to a general surgeon for further evaluation and potential removal.  Allergic Rhinitis Multiple allergies identified, including dog, cockroach, and dust mites. Current treatment with Astelin nasal spray, which is effective but causes dryness. -Continue Astelin nasal spray and saline sprays for symptom management.  Deviated Nasal Septum Significant deviation to the right, causing difficulty with airflow. -Consider referral to an ENT specialist for potential surgical correction when patient is ready.  General Health Maintenance / Followup Plans -Schedule standard wellness exam with fasting labs in late April or May.

## 2023-12-05 ENCOUNTER — Encounter: Payer: Self-pay | Admitting: Medical

## 2023-12-06 MED ORDER — SILDENAFIL CITRATE 50 MG PO TABS
50.0000 mg | ORAL_TABLET | Freq: Every day | ORAL | 3 refills | Status: DC | PRN
Start: 2023-12-06 — End: 2023-12-14

## 2023-12-09 ENCOUNTER — Other Ambulatory Visit: Payer: Self-pay | Admitting: Medical

## 2023-12-14 ENCOUNTER — Telehealth: Payer: Self-pay

## 2023-12-14 MED ORDER — TADALAFIL 5 MG PO TABS: 5.0000 mg | ORAL_TABLET | Freq: Every day | ORAL | 1 refills | Status: DC | PRN

## 2023-12-14 NOTE — Addendum Note (Signed)
Addended by: Gwenevere Abbot on: 12/14/2023 10:13 AM   Modules accepted: Orders

## 2023-12-14 NOTE — Telephone Encounter (Signed)
PA initiated via Covermymeds; KEY: [id number].   PA approved. Effective 12/14/23 to 12/13/26

## 2023-12-17 ENCOUNTER — Other Ambulatory Visit (HOSPITAL_COMMUNITY): Payer: Self-pay

## 2024-01-11 NOTE — Addendum Note (Signed)
 Addended by: Gwenevere Abbot on: 01/11/2024 06:38 AM   Modules accepted: Orders

## 2024-01-18 DIAGNOSIS — D171 Benign lipomatous neoplasm of skin and subcutaneous tissue of trunk: Secondary | ICD-10-CM | POA: Diagnosis not present

## 2024-03-08 ENCOUNTER — Other Ambulatory Visit: Payer: Self-pay | Admitting: Medical

## 2024-04-30 ENCOUNTER — Other Ambulatory Visit: Payer: Self-pay | Admitting: Medical

## 2024-05-15 ENCOUNTER — Encounter: Payer: Self-pay | Admitting: Medical

## 2024-05-17 MED ORDER — SILDENAFIL CITRATE 50 MG PO TABS: 50.0000 mg | ORAL_TABLET | Freq: Every day | ORAL | 0 refills | Status: DC | PRN

## 2024-05-17 NOTE — Addendum Note (Signed)
 Addended by: DORINA DALLAS HERO on: 05/17/2024 07:58 AM   Modules accepted: Orders

## 2024-05-26 ENCOUNTER — Other Ambulatory Visit: Payer: Self-pay | Admitting: Medical

## 2024-06-02 DIAGNOSIS — D171 Benign lipomatous neoplasm of skin and subcutaneous tissue of trunk: Secondary | ICD-10-CM | POA: Diagnosis not present

## 2024-06-02 DIAGNOSIS — J309 Allergic rhinitis, unspecified: Secondary | ICD-10-CM | POA: Diagnosis not present

## 2024-06-02 DIAGNOSIS — N529 Male erectile dysfunction, unspecified: Secondary | ICD-10-CM | POA: Diagnosis not present

## 2024-06-02 DIAGNOSIS — I10 Essential (primary) hypertension: Secondary | ICD-10-CM | POA: Diagnosis not present

## 2024-06-05 ENCOUNTER — Other Ambulatory Visit: Payer: Self-pay | Admitting: Medical

## 2024-06-06 ENCOUNTER — Other Ambulatory Visit: Payer: Self-pay | Admitting: Medical

## 2024-09-04 ENCOUNTER — Other Ambulatory Visit: Payer: Self-pay | Admitting: Medical

## 2024-10-18 ENCOUNTER — Other Ambulatory Visit: Payer: Self-pay | Admitting: Internal Medicine
